# Patient Record
Sex: Female | Born: 1980 | Race: White | Hispanic: No | Marital: Single | Smoking: Current some day smoker
Health system: Southern US, Community
[De-identification: ages and names within clinical notes are randomized; demographics above are authoritative.]

## PROBLEM LIST (undated history)

## (undated) DIAGNOSIS — G8929 Other chronic pain: Secondary | ICD-10-CM

## (undated) DIAGNOSIS — O26619 Liver and biliary tract disorders in pregnancy, unspecified trimester: Secondary | ICD-10-CM

## (undated) DIAGNOSIS — Z9889 Other specified postprocedural states: Secondary | ICD-10-CM

## (undated) DIAGNOSIS — T4145XA Adverse effect of unspecified anesthetic, initial encounter: Secondary | ICD-10-CM

## (undated) DIAGNOSIS — R339 Retention of urine, unspecified: Secondary | ICD-10-CM

## (undated) DIAGNOSIS — B999 Unspecified infectious disease: Secondary | ICD-10-CM

## (undated) DIAGNOSIS — R519 Headache, unspecified: Secondary | ICD-10-CM

## (undated) DIAGNOSIS — J45909 Unspecified asthma, uncomplicated: Secondary | ICD-10-CM

## (undated) DIAGNOSIS — R51 Headache: Secondary | ICD-10-CM

## (undated) DIAGNOSIS — K831 Obstruction of bile duct: Secondary | ICD-10-CM

## (undated) DIAGNOSIS — R112 Nausea with vomiting, unspecified: Secondary | ICD-10-CM

## (undated) DIAGNOSIS — IMO0002 Reserved for concepts with insufficient information to code with codable children: Secondary | ICD-10-CM

## (undated) DIAGNOSIS — K589 Irritable bowel syndrome without diarrhea: Secondary | ICD-10-CM

## (undated) DIAGNOSIS — O26649 Intrahepatic cholestasis of pregnancy, unspecified trimester: Secondary | ICD-10-CM

## (undated) DIAGNOSIS — K219 Gastro-esophageal reflux disease without esophagitis: Secondary | ICD-10-CM

## (undated) DIAGNOSIS — T8859XA Other complications of anesthesia, initial encounter: Secondary | ICD-10-CM

## (undated) DIAGNOSIS — M549 Dorsalgia, unspecified: Secondary | ICD-10-CM

## (undated) DIAGNOSIS — M797 Fibromyalgia: Secondary | ICD-10-CM

## (undated) DIAGNOSIS — M722 Plantar fascial fibromatosis: Secondary | ICD-10-CM

## (undated) DIAGNOSIS — R3915 Urgency of urination: Secondary | ICD-10-CM

## (undated) HISTORY — DX: Irritable bowel syndrome, unspecified: K58.9

## (undated) HISTORY — DX: Other chronic pain: G89.29

## (undated) HISTORY — PX: GALLBLADDER SURGERY: SHX652

## (undated) HISTORY — DX: Headache: R51

## (undated) HISTORY — DX: Headache, unspecified: R51.9

## (undated) HISTORY — PX: WISDOM TOOTH EXTRACTION: SHX21

## (undated) HISTORY — DX: Fibromyalgia: M79.7

---
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
## 2002-06-24 ENCOUNTER — Ambulatory Visit (HOSPITAL_COMMUNITY): Admission: RE | Admit: 2002-06-24 | Discharge: 2002-06-24 | Payer: Self-pay | Admitting: Family Medicine

## 2002-06-24 ENCOUNTER — Encounter: Payer: Self-pay | Admitting: Family Medicine

## 2004-02-13 ENCOUNTER — Ambulatory Visit (HOSPITAL_COMMUNITY): Admission: RE | Admit: 2004-02-13 | Discharge: 2004-02-13 | Payer: Self-pay | Admitting: Internal Medicine

## 2004-04-08 ENCOUNTER — Ambulatory Visit (HOSPITAL_COMMUNITY): Admission: RE | Admit: 2004-04-08 | Discharge: 2004-04-08 | Payer: Self-pay | Admitting: Internal Medicine

## 2006-04-20 ENCOUNTER — Emergency Department (HOSPITAL_COMMUNITY): Admission: EM | Admit: 2006-04-20 | Discharge: 2006-04-20 | Payer: Self-pay | Admitting: Family Medicine

## 2006-09-18 ENCOUNTER — Emergency Department (HOSPITAL_COMMUNITY): Admission: EM | Admit: 2006-09-18 | Discharge: 2006-09-18 | Payer: Self-pay | Admitting: *Deleted

## 2006-11-14 ENCOUNTER — Emergency Department (HOSPITAL_COMMUNITY): Admission: EM | Admit: 2006-11-14 | Discharge: 2006-11-14 | Payer: Self-pay | Admitting: Family Medicine

## 2006-12-04 ENCOUNTER — Ambulatory Visit (HOSPITAL_COMMUNITY): Admission: RE | Admit: 2006-12-04 | Discharge: 2006-12-04 | Payer: Self-pay | Admitting: Chiropractic Medicine

## 2007-02-05 ENCOUNTER — Emergency Department (HOSPITAL_COMMUNITY): Admission: EM | Admit: 2007-02-05 | Discharge: 2007-02-05 | Payer: Self-pay | Admitting: Family Medicine

## 2007-03-15 ENCOUNTER — Emergency Department (HOSPITAL_COMMUNITY): Admission: EM | Admit: 2007-03-15 | Discharge: 2007-03-15 | Payer: Self-pay | Admitting: Family Medicine

## 2010-12-19 NOTE — L&D Delivery Note (Signed)
Pt slowly completed the 1st stage.  Pt pushed for 2 hours and developed exhaustion. The VE was placed at +3 station. She delivered one live viable white female over 2nd degree midline tear in ROA position. Placenta S/I. EBL-400cc.   Tear closed with 3-0 Chromic.

## 2011-04-30 ENCOUNTER — Emergency Department (HOSPITAL_COMMUNITY)
Admission: EM | Admit: 2011-04-30 | Discharge: 2011-05-01 | Disposition: A | Discharge status: Home / Self Care | Payer: Medicaid Other | Attending: Emergency Medicine | Admitting: Emergency Medicine

## 2011-04-30 DIAGNOSIS — R109 Unspecified abdominal pain: Secondary | ICD-10-CM | POA: Insufficient documentation

## 2011-04-30 DIAGNOSIS — N760 Acute vaginitis: Secondary | ICD-10-CM | POA: Insufficient documentation

## 2011-04-30 DIAGNOSIS — A499 Bacterial infection, unspecified: Secondary | ICD-10-CM | POA: Insufficient documentation

## 2011-04-30 DIAGNOSIS — B9689 Other specified bacterial agents as the cause of diseases classified elsewhere: Secondary | ICD-10-CM | POA: Insufficient documentation

## 2011-04-30 DIAGNOSIS — N949 Unspecified condition associated with female genital organs and menstrual cycle: Secondary | ICD-10-CM | POA: Insufficient documentation

## 2011-04-30 DIAGNOSIS — O99891 Other specified diseases and conditions complicating pregnancy: Secondary | ICD-10-CM | POA: Insufficient documentation

## 2011-04-30 DIAGNOSIS — O2390 Unspecified genitourinary tract infection in pregnancy, unspecified trimester: Secondary | ICD-10-CM | POA: Insufficient documentation

## 2011-05-01 ENCOUNTER — Emergency Department (HOSPITAL_COMMUNITY): Payer: Medicaid Other

## 2011-05-01 LAB — RPR: RPR: NONREACTIVE

## 2011-05-01 LAB — CBC
HCT: 38.9 % (ref 36.0–46.0)
MCH: 29.3 pg (ref 26.0–34.0)
MCV: 85.7 fL (ref 78.0–100.0)
RBC: 4.54 MIL/uL (ref 3.87–5.11)
RDW: 12.7 % (ref 11.5–15.5)
WBC: 7.4 10*3/uL (ref 4.0–10.5)

## 2011-05-01 LAB — URINALYSIS, ROUTINE W REFLEX MICROSCOPIC
Bilirubin Urine: NEGATIVE
Glucose, UA: NEGATIVE mg/dL
Ketones, ur: NEGATIVE mg/dL
Nitrite: NEGATIVE
Protein, ur: NEGATIVE mg/dL
Urobilinogen, UA: 1 mg/dL (ref 0.0–1.0)
pH: 6.5 (ref 5.0–8.0)

## 2011-05-01 LAB — URINE MICROSCOPIC-ADD ON

## 2011-05-01 LAB — WET PREP, GENITAL: Yeast Wet Prep HPF POC: NONE SEEN

## 2011-05-01 LAB — RUBELLA ANTIBODY, IGM: Rubella: NON-IMMUNE/NOT IMMUNE

## 2011-09-29 ENCOUNTER — Inpatient Hospital Stay (HOSPITAL_COMMUNITY)
Admission: AD | Admit: 2011-09-29 | Discharge: 2011-09-29 | Disposition: A | Discharge status: Home / Self Care | Payer: Medicaid Other | Source: Ambulatory Visit | Attending: Obstetrics and Gynecology | Admitting: Obstetrics and Gynecology

## 2011-09-29 ENCOUNTER — Encounter (HOSPITAL_COMMUNITY): Payer: Self-pay

## 2011-09-29 DIAGNOSIS — O2340 Unspecified infection of urinary tract in pregnancy, unspecified trimester: Secondary | ICD-10-CM

## 2011-09-29 DIAGNOSIS — N949 Unspecified condition associated with female genital organs and menstrual cycle: Secondary | ICD-10-CM | POA: Insufficient documentation

## 2011-09-29 DIAGNOSIS — N390 Urinary tract infection, site not specified: Secondary | ICD-10-CM

## 2011-09-29 DIAGNOSIS — O2390 Unspecified genitourinary tract infection in pregnancy, unspecified trimester: Secondary | ICD-10-CM | POA: Insufficient documentation

## 2011-09-29 LAB — URINALYSIS, ROUTINE W REFLEX MICROSCOPIC
Glucose, UA: NEGATIVE mg/dL
Protein, ur: NEGATIVE mg/dL
Specific Gravity, Urine: 1.02 (ref 1.005–1.030)
pH: 6.5 (ref 5.0–8.0)

## 2011-09-29 LAB — URINE MICROSCOPIC-ADD ON

## 2011-09-29 MED ORDER — NITROFURANTOIN MONOHYD MACRO 100 MG PO CAPS: 100.0000 mg | ORAL_CAPSULE | Freq: Two times a day (BID) | ORAL | Status: AC

## 2011-09-29 NOTE — ED Provider Notes (Signed)
History     Chief Complaint  Patient presents with  . Abdominal Pain   HPI G1 P0 at 26.5 weeks presents with c/o urinary discomfort and frequency for several days. Denies flank pain or fever. Denies contractions or bleeding. + fetal movement.  Has never had a UTI before. No history of PTL or other issues with pregnancy  No past medical history on file.  No past surgical history on file.  No family history on file.  History  Substance Use Topics  . Smoking status: Not on file  . Smokeless tobacco: Not on file  . Alcohol Use: Not on file    Allergies:  Allergies  Allergen Reactions  . Penicillins Other (See Comments)    Patient states that reaction to this medication, is a childhood reaction.    Prescriptions prior to admission  Medication Sig Dispense Refill  . Prenat w/o A Vit-FeFum-FePo-FA (FOLIVANE-OB) 130-92.4-1 MG CAPS Take 1 capsule by mouth daily.        . Prenat w/o A-FeCbn-DSS-FA-DHA (MACNATAL CN DHA) 28-1-250 MG CAPS Take 1 capsule by mouth daily.        . Prenat-FeFum-FePo-FA-Omega 3 (CONCEPT DHA) 53.5-38-1 MG CAPS Take 1 capsule by mouth daily.        . valACYclovir (VALTREX) 1000 MG tablet Take 1,000 mg by mouth 2 (two) times daily.          Review of Systems  Constitutional: Negative for fever and chills.  Gastrointestinal: Negative for nausea, vomiting, abdominal pain, diarrhea and constipation.  Genitourinary: Positive for dysuria and frequency.   Physical Exam   Blood pressure 129/68, pulse 87, temperature 98.4 F (36.9 C), temperature source Oral, resp. rate 20, height 5\' 4"  (1.626 m), weight 191 lb 6.4 oz (86.818 kg), SpO2 99.00%.  Physical Exam  Constitutional: She is oriented to person, place, and time. She appears well-developed and well-nourished.  HENT:  Head: Normocephalic.  Cardiovascular: Normal rate.   Respiratory: Effort normal.  GI: Soft. There is no tenderness. There is no rebound and no guarding.  Genitourinary: Vagina normal and  uterus normal. No vaginal discharge found.       EFM reassuring FHR with no contractions. Cervix closed/long/-3.  Musculoskeletal: Normal range of motion.  Neurological: She is alert and oriented to person, place, and time.  Skin: Skin is warm and dry.  Psychiatric: She has a normal mood and affect.    MAU Course  Procedures   Assessment and Plan  A:  Pelvic pain with dysuria Probable UTI   P:  Consulted Dr Henderson Cloud Will treat for UTI and d/c home Urine to culture  [facility] 09/29/2011, 6:42 PM

## 2011-09-29 NOTE — Progress Notes (Signed)
PT SAYS THIS LOWER PAIN STARTED  WED AM-  SHE CALLED OFFICE THIS AM-  TOLD TO COME HERE.  HURTS TO URINATE- AND FREQ.Marland Kitchen  DENIES UC, BLEEDING, ROM.    ANY MOVEMENT  HURTS.

## 2011-09-29 NOTE — Progress Notes (Signed)
Pt states she started having constant lower abdominal pain 10-10 in the am. Has been having urinary frequency with only urinating a little for about one week. Now painful with urination. No leaking or bleeding. Reports fetal movement.

## 2011-10-01 LAB — URINE CULTURE: Colony Count: 70000

## 2011-12-06 ENCOUNTER — Inpatient Hospital Stay (HOSPITAL_COMMUNITY)
Admission: AD | Admit: 2011-12-06 | Discharge: 2011-12-06 | Disposition: A | Discharge status: Home / Self Care | Payer: Medicaid Other | Source: Ambulatory Visit | Attending: Obstetrics and Gynecology | Admitting: Obstetrics and Gynecology

## 2011-12-06 ENCOUNTER — Other Ambulatory Visit: Payer: Self-pay | Admitting: Obstetrics and Gynecology

## 2011-12-06 ENCOUNTER — Encounter (HOSPITAL_COMMUNITY): Payer: Self-pay | Admitting: *Deleted

## 2011-12-06 DIAGNOSIS — O99891 Other specified diseases and conditions complicating pregnancy: Secondary | ICD-10-CM | POA: Insufficient documentation

## 2011-12-06 DIAGNOSIS — R7989 Other specified abnormal findings of blood chemistry: Secondary | ICD-10-CM

## 2011-12-06 LAB — URINE MICROSCOPIC-ADD ON

## 2011-12-06 LAB — STREP B DNA PROBE: GBS: POSITIVE

## 2011-12-06 LAB — URINALYSIS, ROUTINE W REFLEX MICROSCOPIC
Bilirubin Urine: NEGATIVE
Glucose, UA: NEGATIVE mg/dL
Hgb urine dipstick: NEGATIVE
Ketones, ur: NEGATIVE mg/dL
Nitrite: NEGATIVE
Protein, ur: NEGATIVE mg/dL
Specific Gravity, Urine: >1.03 — ABNORMAL HIGH (ref 1.005–1.030)
Urobilinogen, UA: 0.2 mg/dL (ref 0.0–1.0)
pH: 6 (ref 5.0–8.0)

## 2011-12-06 LAB — CULTURE, BETA STREP (GROUP B ONLY): Organism ID, Bacteria: POSITIVE

## 2011-12-06 NOTE — Progress Notes (Signed)
Pt sent from office due to lab results.  Pt states "MD told me that my liver is not working and I need to go to office."    Denies any headaches, dizziness, or blurred vision.  Reports extreme itching.  Denies any bleeding or leaking of fluid.  + FM.

## 2011-12-06 NOTE — ED Provider Notes (Signed)
History   Pt presents today for evaluation of elevated LFTs. She is currently 36.3wks. She denies HA, blurry vision, or RUQ pain. She states she has been itching terribly for the past couple of months. She reports GFM.  Chief Complaint  Patient presents with  . Pruritis   HPI  OB History    Grav Para Term Preterm Abortions TAB SAB Ect Mult Living   1               Past Medical History  Diagnosis Date  . Hypertension     Past Surgical History  Procedure Date  . Wisdom tooth extraction     No family history on file.  History  Substance Use Topics  . Smoking status: Former Games developer  . Smokeless tobacco: Not on file  . Alcohol Use: No    Allergies:  Allergies  Allergen Reactions  . Penicillins Other (See Comments)    Patient states that reaction to this medication, is a childhood reaction.    Prescriptions prior to admission  Medication Sig Dispense Refill  . hydrocortisone cream 1 % Apply 1 application topically daily as needed. For itching.       Burnis Medin w/o A Vit-FeFum-FePo-FA (FOLIVANE-OB) 130-92.4-1 MG CAPS Take 1 capsule by mouth daily.        Marland Kitchen zolpidem (AMBIEN) 10 MG tablet Take 10 mg by mouth at bedtime as needed. For sleep.         Review of Systems  Constitutional: Negative for fever.  Cardiovascular: Negative for chest pain and palpitations.  Gastrointestinal: Negative for nausea, vomiting, abdominal pain, diarrhea and constipation.  Genitourinary: Negative for dysuria, urgency, frequency and hematuria.  Skin: Positive for itching. Negative for rash.  Neurological: Negative for dizziness and headaches.  Psychiatric/Behavioral: Negative for depression and suicidal ideas.   Physical Exam   Blood pressure 119/76, pulse 98, temperature 97.9 F (36.6 C), temperature source Oral, resp. rate 18, height 5\' 3"  (1.6 m), weight 208 lb 6.4 oz (94.53 kg).  Physical Exam  Nursing note and vitals reviewed. Constitutional: She is oriented to person, place, and  time. She appears well-developed and well-nourished. No distress.  HENT:  Head: Normocephalic and atraumatic.  Eyes: EOM are normal. Pupils are equal, round, and reactive to light.  GI: Soft. She exhibits no distension. There is no tenderness. There is no rebound and no guarding.  Neurological: She is alert and oriented to person, place, and time.  Skin: Skin is warm and dry. She is not diaphoretic.  Psychiatric: She has a normal mood and affect. Her behavior is normal. Judgment and thought content normal.    MAU Course  Procedures  Labs sent from office and reviewed. ALT - 203, AST - 68, platelets 233  Discussed pt with Dr. Henderson Cloud at length.  Assessment and Plan  Elevated LFTs: Will check bile acids and hepatitis panel. Pt will f/u in office on Thursday. No evidence of preeclampsia today. Discussed diet, activity, risks, and precautions. Reminded of [facility].  Clinton Gallant. Rice III, DrHSc, MPAS, PA-C  12/06/2011, 3:08 PM   Henrietta Hoover, PA 12/06/11 1513

## 2011-12-07 LAB — HEPATITIS PANEL, ACUTE
Hep B C IgM: NEGATIVE
Hepatitis B Surface Ag: NEGATIVE

## 2011-12-08 LAB — BILE ACIDS, TOTAL: Bile Acids Total: 11 umol/L (ref 0–19)

## 2011-12-09 ENCOUNTER — Inpatient Hospital Stay (HOSPITAL_COMMUNITY)
Admission: AD | Admit: 2011-12-09 | Discharge: 2011-12-12 | DRG: 775 | Disposition: A | Discharge status: Home / Self Care | Payer: Medicaid Other | Source: Ambulatory Visit | Attending: Obstetrics and Gynecology | Admitting: Obstetrics and Gynecology

## 2011-12-09 ENCOUNTER — Encounter (HOSPITAL_COMMUNITY): Payer: Self-pay | Admitting: *Deleted

## 2011-12-09 DIAGNOSIS — O99892 Other specified diseases and conditions complicating childbirth: Secondary | ICD-10-CM | POA: Diagnosis present

## 2011-12-09 DIAGNOSIS — Z3480 Encounter for supervision of other normal pregnancy, unspecified trimester: Secondary | ICD-10-CM

## 2011-12-09 DIAGNOSIS — Z22330 Carrier of Group B streptococcus: Secondary | ICD-10-CM

## 2011-12-09 DIAGNOSIS — K838 Other specified diseases of biliary tract: Secondary | ICD-10-CM | POA: Diagnosis present

## 2011-12-09 HISTORY — DX: Liver and biliary tract disorders in pregnancy, unspecified trimester: O26.619

## 2011-12-09 HISTORY — DX: Unspecified infectious disease: B99.9

## 2011-12-09 HISTORY — DX: Obstruction of bile duct: K83.1

## 2011-12-09 HISTORY — DX: Intrahepatic cholestasis of pregnancy, unspecified trimester: O26.649

## 2011-12-09 LAB — CBC
Hemoglobin: 12 g/dL (ref 12.0–15.0)
MCHC: 33.7 g/dL (ref 30.0–36.0)
RDW: 13.2 % (ref 11.5–15.5)

## 2011-12-09 LAB — COMPREHENSIVE METABOLIC PANEL
Albumin: 2.5 g/dL — ABNORMAL LOW (ref 3.5–5.2)
BUN: 7 mg/dL (ref 6–23)
Calcium: 9 mg/dL (ref 8.4–10.5)
Chloride: 101 mEq/L (ref 96–112)
Creatinine, Ser: 0.57 mg/dL (ref 0.50–1.10)
Total Bilirubin: 0.2 mg/dL — ABNORMAL LOW (ref 0.3–1.2)

## 2011-12-09 LAB — URIC ACID: Uric Acid, Serum: 3.9 mg/dL (ref 2.4–7.0)

## 2011-12-09 LAB — LACTATE DEHYDROGENASE: LDH: 207 U/L (ref 94–250)

## 2011-12-09 MED ORDER — OXYTOCIN 20 UNITS IN LACTATED RINGERS INFUSION - SIMPLE
1.0000 m[IU]/min | INTRAVENOUS | Status: DC
  Administered 2011-12-09: 2 m[IU]/min via INTRAVENOUS
  Administered 2011-12-10: 300 m[IU]/min via INTRAVENOUS
  Filled 2011-12-09: qty 1000

## 2011-12-09 MED ORDER — LACTATED RINGERS IV SOLN
INTRAVENOUS | Status: DC
  Administered 2011-12-10: 02:00:00 via INTRAVENOUS

## 2011-12-09 MED ORDER — NALBUPHINE SYRINGE 5 MG/0.5 ML
5.0000 mg | INJECTION | INTRAMUSCULAR | Status: DC | PRN
  Administered 2011-12-10: 5 mg via INTRAVENOUS
  Filled 2011-12-09: qty 0.5

## 2011-12-09 MED ORDER — VALACYCLOVIR HCL 500 MG PO TABS
500.0000 mg | ORAL_TABLET | Freq: Once | ORAL | Status: AC
  Administered 2011-12-09: 500 mg via ORAL
  Filled 2011-12-09: qty 1

## 2011-12-09 MED ORDER — CEFAZOLIN SODIUM-DEXTROSE 2-3 GM-% IV SOLR
2.0000 g | Freq: Once | INTRAVENOUS | Status: AC
  Administered 2011-12-09: 2 g via INTRAVENOUS
  Filled 2011-12-09: qty 50

## 2011-12-09 MED ORDER — LIDOCAINE HCL (PF) 1 % IJ SOLN
30.0000 mL | INTRAMUSCULAR | Status: DC | PRN
  Administered 2011-12-10: 30 mL via SUBCUTANEOUS
  Filled 2011-12-09: qty 30

## 2011-12-09 MED ORDER — OXYCODONE-ACETAMINOPHEN 5-325 MG PO TABS
2.0000 | ORAL_TABLET | ORAL | Status: DC | PRN
  Administered 2011-12-10: 1 via ORAL
  Filled 2011-12-09: qty 2

## 2011-12-09 MED ORDER — TERBUTALINE SULFATE 1 MG/ML IJ SOLN: 0.2500 mg | Freq: Once | INTRAMUSCULAR | Status: AC | PRN

## 2011-12-09 MED ORDER — CEFAZOLIN SODIUM 1-5 GM-% IV SOLN
1.0000 g | Freq: Three times a day (TID) | INTRAVENOUS | Status: DC
  Administered 2011-12-10 (×2): 1 g via INTRAVENOUS
  Filled 2011-12-09 (×3): qty 50

## 2011-12-09 MED ORDER — BUTORPHANOL TARTRATE 2 MG/ML IJ SOLN
1.0000 mg | INTRAMUSCULAR | Status: DC | PRN
  Administered 2011-12-10 (×2): 1 mg via INTRAVENOUS
  Filled 2011-12-09 (×2): qty 1

## 2011-12-09 MED ORDER — ZOLPIDEM TARTRATE 10 MG PO TABS: 10.0000 mg | ORAL_TABLET | Freq: Every evening | ORAL | Status: DC | PRN

## 2011-12-09 MED ORDER — OXYTOCIN 20 UNITS IN LACTATED RINGERS INFUSION - SIMPLE
125.0000 mL/h | Freq: Once | INTRAVENOUS | Status: AC
  Administered 2011-12-10: 500 mL/h via INTRAVENOUS

## 2011-12-09 MED ORDER — CITRIC ACID-SODIUM CITRATE 334-500 MG/5ML PO SOLN: 30.0000 mL | ORAL | Status: DC | PRN

## 2011-12-09 MED ORDER — LACTATED RINGERS IV SOLN
INTRAVENOUS | Status: DC
  Administered 2011-12-09 – 2011-12-10 (×5): via INTRAVENOUS

## 2011-12-09 MED ORDER — IBUPROFEN 600 MG PO TABS
600.0000 mg | ORAL_TABLET | Freq: Four times a day (QID) | ORAL | Status: DC | PRN
  Administered 2011-12-10: 600 mg via ORAL
  Filled 2011-12-09: qty 1

## 2011-12-09 MED ORDER — ACETAMINOPHEN 325 MG PO TABS: 650.0000 mg | ORAL_TABLET | ORAL | Status: DC | PRN

## 2011-12-09 MED ORDER — LACTATED RINGERS IV SOLN
500.0000 mL | INTRAVENOUS | Status: DC | PRN
  Administered 2011-12-10 (×2): 500 mL via INTRAVENOUS

## 2011-12-09 MED ORDER — FLEET ENEMA 7-19 GM/118ML RE ENEM: 1.0000 | ENEMA | RECTAL | Status: DC | PRN

## 2011-12-09 MED ORDER — HYDROXYZINE HCL 50 MG/ML IM SOLN
25.0000 mg | Freq: Four times a day (QID) | INTRAMUSCULAR | Status: DC | PRN
  Administered 2011-12-09 (×2): 25 mg via INTRAMUSCULAR
  Filled 2011-12-09 (×3): qty 1

## 2011-12-09 MED ORDER — ONDANSETRON HCL 4 MG/2ML IJ SOLN
4.0000 mg | Freq: Four times a day (QID) | INTRAMUSCULAR | Status: DC | PRN
  Administered 2011-12-10: 4 mg via INTRAVENOUS
  Filled 2011-12-09: qty 2

## 2011-12-09 MED ORDER — OXYTOCIN BOLUS FROM INFUSION
500.0000 mL | Freq: Once | INTRAVENOUS | Status: DC
  Filled 2011-12-09: qty 500
  Filled 2011-12-09: qty 1000

## 2011-12-09 NOTE — Progress Notes (Signed)
2130:Pt ambulatory to room 172 for IOL for cholestasis, accompanied by family, myself, and B. Daphine Deutscher, CNA.  Report given to Darrow Bussing, RN.  Cheral Marker

## 2011-12-09 NOTE — Progress Notes (Signed)
Room finally available for pt.   FHTs NST R. SVE 1-2/60/-2, AROM clear  Bile acids in office all elevated- confirms diagnosis of cholestasis of pregnancy.  Needs induction. Drelyn Pistilli A

## 2011-12-09 NOTE — Progress Notes (Signed)
Pt in for scheduled induction for elevated LFT's, brought to MAU while waiting for a bed on birthing suites.  Denies any bleeding, leaking of fluid, or discharge. + FM.

## 2011-12-09 NOTE — H&P (Signed)
30 y.o. [redacted]w[redacted]d   G1P0 comes in c/o itching and elevated LFTs.  Otherwise has good fetal movement and no bleeding.  Pt has no s/s preeclampsia; symptoms consistent with cholestasis.  Recommendation is for delivery at term with elevated LFTS.  LFTs have decreased slightly from last Friday but are still elevated.  Past Medical History  Diagnosis Date  . Hypertension   . Cholestasis of pregnancy     Past Surgical History  Procedure Date  . Wisdom tooth extraction     OB History    Grav Para Term Preterm Abortions TAB SAB Ect Mult Living   1              # Outc Date GA Lbr Len/2nd Wgt Sex Del Anes PTL Lv   1 CUR               History   Social History  . Marital Status: Single    Spouse Name: N/A    Number of Children: N/A  . Years of Education: N/A   Occupational History  . Not on file.   Social History Main Topics  . Smoking status: Former Games developer  . Smokeless tobacco: Not on file  . Alcohol Use: No  . Drug Use: No  . Sexually Active: Yes   Other Topics Concern  . Not on file   Social History Narrative  . No narrative on file   Penicillins   Prenatal Course:  Presumed cholestasis- bile acids pending.  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  For induction of labor.   GBS positive and resistant to clindamycin.  D/w pt rare risk of cross reaction of allergy with penicillin.   No s/s active HSV lesion.  Guiseppe Flanagan A

## 2011-12-10 ENCOUNTER — Encounter (HOSPITAL_COMMUNITY): Payer: Self-pay | Admitting: Anesthesiology

## 2011-12-10 ENCOUNTER — Encounter (HOSPITAL_COMMUNITY): Payer: Self-pay

## 2011-12-10 ENCOUNTER — Inpatient Hospital Stay (HOSPITAL_COMMUNITY): Payer: Medicaid Other | Admitting: Anesthesiology

## 2011-12-10 LAB — RPR: RPR Ser Ql: NONREACTIVE

## 2011-12-10 MED ORDER — DIPHENHYDRAMINE HCL 50 MG/ML IJ SOLN: 12.5000 mg | INTRAMUSCULAR | Status: DC | PRN

## 2011-12-10 MED ORDER — LIDOCAINE HCL 1.5 % IJ SOLN
INTRAMUSCULAR | Status: DC | PRN
  Administered 2011-12-10 (×2): 5 mL via EPIDURAL

## 2011-12-10 MED ORDER — OXYCODONE-ACETAMINOPHEN 5-325 MG PO TABS
1.0000 | ORAL_TABLET | ORAL | Status: DC | PRN
  Administered 2011-12-11 (×2): 2 via ORAL
  Administered 2011-12-11 (×2): 1 via ORAL
  Administered 2011-12-12 (×2): 2 via ORAL
  Filled 2011-12-10: qty 1
  Filled 2011-12-10: qty 2
  Filled 2011-12-10: qty 1
  Filled 2011-12-10 (×2): qty 2
  Filled 2011-12-10 (×2): qty 1

## 2011-12-10 MED ORDER — ONDANSETRON HCL 4 MG/2ML IJ SOLN: 4.0000 mg | INTRAMUSCULAR | Status: DC | PRN

## 2011-12-10 MED ORDER — LACTATED RINGERS IV SOLN
500.0000 mL | Freq: Once | INTRAVENOUS | Status: AC
  Administered 2011-12-10: 500 mL via INTRAVENOUS

## 2011-12-10 MED ORDER — BENZOCAINE-MENTHOL 20-0.5 % EX AERO
1.0000 "application " | INHALATION_SPRAY | CUTANEOUS | Status: DC | PRN
  Administered 2011-12-11 (×2): 1 via TOPICAL

## 2011-12-10 MED ORDER — ONDANSETRON HCL 4 MG PO TABS: 4.0000 mg | ORAL_TABLET | ORAL | Status: DC | PRN

## 2011-12-10 MED ORDER — SIMETHICONE 80 MG PO CHEW
80.0000 mg | CHEWABLE_TABLET | ORAL | Status: DC | PRN
  Administered 2011-12-12: 80 mg via ORAL

## 2011-12-10 MED ORDER — TETANUS-DIPHTH-ACELL PERTUSSIS 5-2.5-18.5 LF-MCG/0.5 IM SUSP
0.5000 mL | Freq: Once | INTRAMUSCULAR | Status: AC
  Administered 2011-12-11: 0.5 mL via INTRAMUSCULAR

## 2011-12-10 MED ORDER — PHENYLEPHRINE 40 MCG/ML (10ML) SYRINGE FOR IV PUSH (FOR BLOOD PRESSURE SUPPORT): 80.0000 ug | PREFILLED_SYRINGE | INTRAVENOUS | Status: DC | PRN

## 2011-12-10 MED ORDER — DIBUCAINE 1 % RE OINT: 1.0000 "application " | TOPICAL_OINTMENT | RECTAL | Status: DC | PRN

## 2011-12-10 MED ORDER — PHENYLEPHRINE 40 MCG/ML (10ML) SYRINGE FOR IV PUSH (FOR BLOOD PRESSURE SUPPORT)
80.0000 ug | PREFILLED_SYRINGE | INTRAVENOUS | Status: DC | PRN
  Filled 2011-12-10: qty 5

## 2011-12-10 MED ORDER — FENTANYL 2.5 MCG/ML BUPIVACAINE 1/10 % EPIDURAL INFUSION (WH - ANES)
14.0000 mL/h | INTRAMUSCULAR | Status: DC
  Administered 2011-12-10 (×4): 14 mL/h via EPIDURAL
  Filled 2011-12-10 (×5): qty 60

## 2011-12-10 MED ORDER — ZOLPIDEM TARTRATE 5 MG PO TABS: 5.0000 mg | ORAL_TABLET | Freq: Every evening | ORAL | Status: DC | PRN

## 2011-12-10 MED ORDER — WITCH HAZEL-GLYCERIN EX PADS: 1.0000 "application " | MEDICATED_PAD | CUTANEOUS | Status: DC | PRN

## 2011-12-10 MED ORDER — MEASLES, MUMPS & RUBELLA VAC ~~LOC~~ INJ
0.5000 mL | INJECTION | Freq: Once | SUBCUTANEOUS | Status: AC
  Administered 2011-12-12: 0.5 mL via SUBCUTANEOUS
  Filled 2011-12-10 (×2): qty 0.5

## 2011-12-10 MED ORDER — FENTANYL 2.5 MCG/ML BUPIVACAINE 1/10 % EPIDURAL INFUSION (WH - ANES)
INTRAMUSCULAR | Status: DC | PRN
  Administered 2011-12-10: 14 mL/h via EPIDURAL

## 2011-12-10 MED ORDER — IBUPROFEN 600 MG PO TABS
600.0000 mg | ORAL_TABLET | Freq: Four times a day (QID) | ORAL | Status: DC
  Administered 2011-12-11 – 2011-12-12 (×6): 600 mg via ORAL
  Filled 2011-12-10 (×6): qty 1

## 2011-12-10 MED ORDER — EPHEDRINE 5 MG/ML INJ: 10.0000 mg | INTRAVENOUS | Status: DC | PRN

## 2011-12-10 MED ORDER — EPHEDRINE 5 MG/ML INJ
10.0000 mg | INTRAVENOUS | Status: DC | PRN
  Filled 2011-12-10: qty 4

## 2011-12-10 NOTE — Anesthesia Procedure Notes (Signed)
Epidural Patient location during procedure: OB Start time: 12/10/2011 5:21 AM End time: 12/10/2011 5:26 AM Reason for block: procedure for pain  Staffing Anesthesiologist: Sandrea Hughs Performed by: anesthesiologist   Preanesthetic Checklist Completed: patient identified, site marked, surgical consent, pre-op evaluation, timeout performed, IV checked, risks and benefits discussed and monitors and equipment checked  Epidural Patient position: sitting Prep: site prepped and draped and DuraPrep Patient monitoring: continuous pulse ox and blood pressure Approach: midline Injection technique: LOR air  Needle:  Needle type: Tuohy  Needle gauge: 17 G Needle length: 9 cm Needle insertion depth: 6 cm Catheter type: closed end flexible Catheter size: 19 Gauge Catheter at skin depth: 12 cm Test dose: negative and 1.5% lidocaine  Assessment Sensory level: T8 Events: blood not aspirated, injection not painful, no injection resistance, negative IV test and no paresthesia

## 2011-12-10 NOTE — Progress Notes (Signed)
Laboring Down.

## 2011-12-10 NOTE — Plan of Care (Signed)
Problem: Consults Goal: Birthing Suites Patient Information Press F2 to bring up selections list  Outcome: Completed/Met Date Met:  12/10/11  Pt 37-[redacted] weeks EGA, Inpatient induction and Other (specify with a note) pt has cholestasis

## 2011-12-10 NOTE — Anesthesia Preprocedure Evaluation (Addendum)
Anesthesia Evaluation  Patient identified by MRN, date of birth, ID band Patient awake    Reviewed: Allergy & Precautions, H&P , NPO status , Patient's Chart, lab work & pertinent test results  Airway Mallampati: II TM Distance: >3 FB Neck ROM: full    Dental No notable dental hx.    Pulmonary neg pulmonary ROS,    Pulmonary exam normal       Cardiovascular     Neuro/Psych Negative Neurological ROS  Negative Psych ROS   GI/Hepatic negative GI ROS, Neg liver ROS,   Endo/Other  Morbid obesity  Renal/GU negative Renal ROS  Genitourinary negative   Musculoskeletal   Abdominal (+) obese,   Peds negative pediatric ROS (+)  Hematology negative hematology ROS (+)   Anesthesia Other Findings   Reproductive/Obstetrics (+) Pregnancy                           Anesthesia Physical Anesthesia Plan  ASA: III  Anesthesia Plan: Epidural   Post-op Pain Management:    Induction:   Airway Management Planned:   Additional Equipment:   Intra-op Plan:   Post-operative Plan:   Informed Consent: I have reviewed the patients History and Physical, chart, labs and discussed the procedure including the risks, benefits and alternatives for the proposed anesthesia with the patient or authorized representative who has indicated his/her understanding and acceptance.     Plan Discussed with:   Anesthesia Plan Comments:         Anesthesia Quick Evaluation

## 2011-12-10 NOTE — Progress Notes (Signed)
Rec'd report & assumed pt care 

## 2011-12-11 LAB — CBC
Hemoglobin: 9.9 g/dL — ABNORMAL LOW (ref 12.0–15.0)
Platelets: 197 10*3/uL (ref 150–400)
RBC: 3.36 MIL/uL — ABNORMAL LOW (ref 3.87–5.11)
WBC: 19.9 10*3/uL — ABNORMAL HIGH (ref 4.0–10.5)

## 2011-12-11 MED ORDER — RHO D IMMUNE GLOBULIN 1500 UNIT/2ML IJ SOLN
300.0000 ug | Freq: Once | INTRAMUSCULAR | Status: AC
  Administered 2011-12-11: 300 ug via INTRAMUSCULAR
  Filled 2011-12-11: qty 2

## 2011-12-11 MED ORDER — SENNOSIDES-DOCUSATE SODIUM 8.6-50 MG PO TABS
2.0000 | ORAL_TABLET | Freq: Every day | ORAL | Status: DC
  Administered 2011-12-11: 2 via ORAL

## 2011-12-11 MED ORDER — BENZOCAINE-MENTHOL 20-0.5 % EX AERO
INHALATION_SPRAY | CUTANEOUS | Status: AC
  Administered 2011-12-11: 1 via TOPICAL
  Filled 2011-12-11: qty 56

## 2011-12-11 MED ORDER — INFLUENZA VIRUS VACC SPLIT PF IM SUSP
0.5000 mL | INTRAMUSCULAR | Status: AC
  Administered 2011-12-11: 0.5 mL via INTRAMUSCULAR
  Filled 2011-12-11: qty 0.5

## 2011-12-11 NOTE — Progress Notes (Signed)
PPD#1 Pt without c/o. Lochia-wnl. IMP/ doing well Plan/ routine care.

## 2011-12-11 NOTE — Anesthesia Postprocedure Evaluation (Signed)
  Anesthesia Post-op Note  Patient: Kiara Dillon  Procedure(s) Performed: * No procedures listed *  Patient Location: Mother/Baby  Anesthesia Type: Epidural  Level of Consciousness: awake, alert  and oriented  Airway and Oxygen Therapy: Patient Spontanous Breathing  Post-op Assessment: Patient's Cardiovascular Status Stable and Respiratory Function Stable  Post-op Vital Signs: Reviewed and stable  Complications: No apparent anesthesia complications

## 2011-12-11 NOTE — Progress Notes (Signed)
Patient does not have order for stool softeners and requests them. Dr. Dareen Piano called. Order received.

## 2011-12-12 LAB — RH IG WORKUP (INCLUDES ABO/RH)
Antibody Screen: POSITIVE
Fetal Screen: NEGATIVE
Gestational Age(Wks): 37

## 2011-12-12 MED ORDER — OXYCODONE-ACETAMINOPHEN 5-325 MG PO TABS: 1.0000 | ORAL_TABLET | ORAL | Status: AC | PRN

## 2011-12-12 NOTE — Progress Notes (Signed)
PPD#2 Pt without c/o. Wants to go home. VSSAF Plan/D/C

## 2011-12-12 NOTE — Progress Notes (Signed)
UR chart review completed.  

## 2011-12-12 NOTE — Discharge Summary (Signed)
Obstetric Discharge Summary Reason for Admission: induction of labor Prenatal Procedures: ultrasound Intrapartum Procedures: VE Postpartum Procedures: none Complications-Operative and Postpartum: 2 degree perineal laceration Hemoglobin  Date Value Range Status  12/11/2011 9.9* 12.0-15.0 (g/dL) Final     RESULT REPEATED AND VERIFIED     DELTA CHECK NOTED     HCT  Date Value Range Status  12/11/2011 29.5* 36.0-46.0 (%) Final    Discharge Diagnoses: Term Pregnancy-delivered  Discharge Information: Date: 12/12/2011 Activity: pelvic rest Diet: routine Medications: PNV and Percocet Condition: improved Instructions: refer to practice specific booklet Discharge to: home Follow-up Information    Follow up with ROSS,KENDRA H. in 1 week.   Contact information:   [address] [place] [address] [phone]          Newborn Data: Live born female  Birth Weight: 6 lb 6.3 oz (2900 g) APGAR: 9, 9  Home with mother.  Samanyu Tinnell E 12/12/2011, 9:45 AM

## 2012-02-09 ENCOUNTER — Encounter (HOSPITAL_COMMUNITY): Payer: Self-pay | Admitting: *Deleted

## 2012-02-09 ENCOUNTER — Emergency Department (HOSPITAL_COMMUNITY)
Admission: EM | Admit: 2012-02-09 | Discharge: 2012-02-09 | Disposition: A | Discharge status: Home / Self Care | Payer: Medicaid Other | Attending: Emergency Medicine | Admitting: Emergency Medicine

## 2012-02-09 DIAGNOSIS — M5380 Other specified dorsopathies, site unspecified: Secondary | ICD-10-CM | POA: Insufficient documentation

## 2012-02-09 DIAGNOSIS — I10 Essential (primary) hypertension: Secondary | ICD-10-CM | POA: Insufficient documentation

## 2012-02-09 DIAGNOSIS — N898 Other specified noninflammatory disorders of vagina: Secondary | ICD-10-CM | POA: Insufficient documentation

## 2012-02-09 DIAGNOSIS — R109 Unspecified abdominal pain: Secondary | ICD-10-CM | POA: Insufficient documentation

## 2012-02-09 DIAGNOSIS — M545 Low back pain, unspecified: Secondary | ICD-10-CM

## 2012-02-09 DIAGNOSIS — N949 Unspecified condition associated with female genital organs and menstrual cycle: Secondary | ICD-10-CM | POA: Insufficient documentation

## 2012-02-09 LAB — URINE MICROSCOPIC-ADD ON

## 2012-02-09 LAB — URINALYSIS, ROUTINE W REFLEX MICROSCOPIC
Bilirubin Urine: NEGATIVE
Nitrite: NEGATIVE
Specific Gravity, Urine: 1.025 (ref 1.005–1.030)
pH: 5.5 (ref 5.0–8.0)

## 2012-02-09 LAB — PREGNANCY, URINE: Preg Test, Ur: NEGATIVE

## 2012-02-09 MED ORDER — CYCLOBENZAPRINE HCL 10 MG PO TABS: 10.0000 mg | ORAL_TABLET | Freq: Two times a day (BID) | ORAL | Status: AC | PRN

## 2012-02-09 MED ORDER — KETOROLAC TROMETHAMINE 60 MG/2ML IM SOLN
60.0000 mg | Freq: Once | INTRAMUSCULAR | Status: AC
  Administered 2012-02-09: 60 mg via INTRAMUSCULAR
  Filled 2012-02-09: qty 2

## 2012-02-09 MED ORDER — HYDROCODONE-ACETAMINOPHEN 5-325 MG PO TABS: 1.0000 | ORAL_TABLET | ORAL | Status: AC | PRN

## 2012-02-09 NOTE — ED Provider Notes (Signed)
History     CSN: [id number]  Arrival date & time 02/09/12  1555   First MD Initiated Contact with Patient 02/09/12 1638      Chief Complaint  Patient presents with  . Back Pain    L lowerback    (Consider location/radiation/quality/duration/timing/severity/associated sxs/prior treatment) HPI  Past Medical History  Diagnosis Date  . Hypertension   . Cholestasis of pregnancy   . 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 Wisdom tooth extraction   . No past surgeries     Family History  Problem Relation Age of Onset  . Arthritis Mother   . Asthma Mother   . Depression Mother   . Hypertension Mother   . Miscarriages / Stillbirths Maternal Aunt   . Stroke Paternal Uncle   . Heart disease Paternal Uncle   . Cancer Maternal Grandmother   . Cancer Maternal Grandfather   . Cancer Paternal Grandmother   . Heart disease Paternal Grandmother   . Cancer Paternal Grandfather     History  Substance Use Topics  . Smoking status: Former Games developer  . Smokeless tobacco: Not on file  . Alcohol Use: Yes    OB History    Grav Para Term Preterm Abortions TAB SAB Ect Mult Living   1 1 1  0 0 0 0 0 0 1      Review of Systems  Allergies  Penicillins  Home Medications   Current Outpatient Rx  Name Route Sig Dispense Refill  . GABAPENTIN 300 MG PO CAPS Oral Take 300 mg by mouth 3 (three) times daily.      BP 120/79  Pulse 88  Temp(Src) 98.1 F (36.7 C) (Oral)  Resp 20  SpO2 100%  Physical Exam  ED Course  Procedures (including critical care time)  Labs Reviewed  URINALYSIS, ROUTINE W REFLEX MICROSCOPIC - Abnormal; Notable for the following:    APPearance CLOUDY (*)    Hgb urine dipstick LARGE (*)    Ketones, ur TRACE (*)    Leukocytes, UA LARGE (*)    All other components within normal limits  URINE MICROSCOPIC-ADD ON - Abnormal; Notable for the following:    Squamous Epithelial / LPF MANY (*)    All  other components within normal limits  WET PREP, GENITAL - Abnormal; Notable for the following:    WBC, Wet Prep HPF POC MODERATE (*)    All other components within normal limits  PREGNANCY, URINE  GC/CHLAMYDIA PROBE AMP, GENITAL  URINE CULTURE   No results found. Results for orders placed during the hospital encounter of 02/09/12  URINALYSIS, ROUTINE W REFLEX MICROSCOPIC      Component Value Range   Color, Urine YELLOW  YELLOW    APPearance CLOUDY (*) CLEAR    Specific Gravity, Urine 1.025  1.005 - 1.030    pH 5.5  5.0 - 8.0    Glucose, UA NEGATIVE  NEGATIVE (mg/dL)   Hgb urine dipstick LARGE (*) NEGATIVE    Bilirubin Urine NEGATIVE  NEGATIVE    Ketones, ur TRACE (*) NEGATIVE (mg/dL)   Protein, ur NEGATIVE  NEGATIVE (mg/dL)   Urobilinogen, UA 0.2  0.0 - 1.0 (mg/dL)   Nitrite NEGATIVE  NEGATIVE    Leukocytes, UA LARGE (*) NEGATIVE   PREGNANCY, URINE      Component Value Range   Preg Test, Ur NEGATIVE  NEGATIVE   URINE MICROSCOPIC-ADD ON  Component Value Range   Squamous Epithelial / LPF MANY (*) RARE    WBC, UA 7-10  <3 (WBC/hpf)   RBC / HPF 3-6  <3 (RBC/hpf)   Bacteria, UA RARE  RARE   WET PREP, GENITAL      Component Value Range   Yeast Wet Prep HPF POC NONE SEEN  NONE SEEN    Trich, Wet Prep NONE SEEN  NONE SEEN    Clue Cells Wet Prep HPF POC NONE SEEN  NONE SEEN    WBC, Wet Prep HPF POC MODERATE (*) NONE SEEN    No results found.    Lower back pain    MDM  Patient here with MSK lower back pain - I have taken over for C. Mayford Knife, PA-C - please see her note for HPI, ROS, PE - labs reveal no abnormalities - doubt UTI - contaminated specimen - will give short course of pain medication and muscle relaxer.        Izola Price [place], [place] 02/09/12 2021

## 2012-02-09 NOTE — ED Notes (Signed)
Pt reports L lower back pain x 3 days-pt reports calling her OB-GYN, states she was instructed to come to the ED for possible ruptured ovarian cyst.  Pt denies any pelvic pain at this time.  Pt denies urinary sxs.  Pt reports recently having a baby x 2 months ago.  Had a mirena placed x 3 weeks ago.  Pt reports nausea but denies any vomiting at this time.

## 2012-02-09 NOTE — Discharge Instructions (Signed)
Back Exercises Back exercises help treat and prevent back injuries. The goal of back exercises is to increase the strength of your abdominal and back muscles and the flexibility of your back. These exercises should be started when you no longer have back pain. Back exercises include:  Pelvic Tilt. Lie on your back with your knees bent. Tilt your pelvis until the lower part of your back is against the floor. Hold this position 5 to 10 sec and repeat 5 to 10 times.   Knee to Chest. Pull first 1 knee up against your chest and hold for 20 to 30 seconds, repeat this with the other knee, and then both knees. This may be done with the other leg straight or bent, whichever feels better.   Sit-Ups or Curl-Ups. Bend your knees 90 degrees. Start with tilting your pelvis, and do a partial, slow sit-up, lifting your trunk only 30 to 45 degrees off the floor. Take at least 2 to 3 seconds for each sit-up. Do not do sit-ups with your knees out straight. If partial sit-ups are difficult, simply do the above but with only tightening your abdominal muscles and holding it as directed.   Hip-Lift. Lie on your back with your knees flexed 90 degrees. Push down with your feet and shoulders as you raise your hips a couple inches off the floor; hold for 10 seconds, repeat 5 to 10 times.   Back arches. Lie on your stomach, propping yourself up on bent elbows. Slowly press on your hands, causing an arch in your low back. Repeat 3 to 5 times. Any initial stiffness and discomfort should lessen with repetition over time.   Shoulder-Lifts. Lie face down with arms beside your body. Keep hips and torso pressed to floor as you slowly lift your head and shoulders off the floor.  Do not overdo your exercises, especially in the beginning. Exercises may cause you some mild back discomfort which lasts for a few minutes; however, if the pain is more severe, or lasts for more than 15 minutes, do not continue exercises until you see your  caregiver. Improvement with exercise therapy for back problems is slow.  See your caregivers for assistance with developing a proper back exercise program. Document Released: 01/12/2005 Document Revised: 08/03/2011 Document Reviewed: 12/05/2005 [employer] Patient Information 2012 [employer].Back Pain, Adult Low back pain is very common. About 1 in 5 people have back pain.The cause of low back pain is rarely dangerous. The pain often gets better over time.About half of people with a sudden onset of back pain feel better in just 2 weeks. About 8 in 10 people feel better by 6 weeks.  CAUSES Some common causes of back pain include:  Strain of the muscles or ligaments supporting the spine.   Wear and tear (degeneration) of the spinal discs.   Arthritis.   Direct injury to the back.  DIAGNOSIS Most of the time, the direct cause of low back pain is not known.However, back pain can be treated effectively even when the exact cause of the pain is unknown.Answering your caregiver's questions about your overall health and symptoms is one of the most accurate ways to make sure the cause of your pain is not dangerous. If your caregiver needs more information, he or she may order lab work or imaging tests (X-rays or MRIs).However, even if imaging tests show changes in your back, this usually does not require surgery. HOME CARE INSTRUCTIONS For many people, back pain returns.Since low back pain is rarely   dangerous, it is often a condition that people can learn to manageon their own.   Remain active. It is stressful on the back to sit or stand in one place. Do not sit, drive, or stand in one place for more than 30 minutes at a time. Take short walks on level surfaces as soon as pain allows.Try to increase the length of time you walk each day.   Do not stay in bed.Resting more than 1 or 2 days can delay your recovery.   Do not avoid exercise or work.Your body is made to move.It is not dangerous  to be active, even though your back may hurt.Your back will likely heal faster if you return to being active before your pain is gone.   Pay attention to your body when you bend and lift. Many people have less discomfortwhen lifting if they bend their knees, keep the load close to their bodies,and avoid twisting. Often, the most comfortable positions are those that put less stress on your recovering back.   Find a comfortable position to sleep. Use a firm mattress and lie on your side with your knees slightly bent. If you lie on your back, put a pillow under your knees.   Only take over-the-counter or prescription medicines as directed by your caregiver. Over-the-counter medicines to reduce pain and inflammation are often the most helpful.Your caregiver may prescribe muscle relaxant drugs.These medicines help dull your pain so you can more quickly return to your normal activities and healthy exercise.   Put ice on the injured area.   Put ice in a plastic bag.   Place a towel between your skin and the bag.   Leave the ice on for 15 to 20 minutes, 3 to 4 times a day for the first 2 to 3 days. After that, ice and heat may be alternated to reduce pain and spasms.   Ask your caregiver about trying back exercises and gentle massage. This may be of some benefit.   Avoid feeling anxious or stressed.Stress increases muscle tension and can worsen back pain.It is important to recognize when you are anxious or stressed and learn ways to manage it.Exercise is a great option.  SEEK MEDICAL CARE IF:  You have pain that is not relieved with rest or medicine.   You have pain that does not improve in 1 week.   You have new symptoms.   You are generally not feeling well.  SEEK IMMEDIATE MEDICAL CARE IF:   You have pain that radiates from your back into your legs.   You develop new bowel or bladder control problems.   You have unusual weakness or numbness in your arms or legs.   You develop  nausea or vomiting.   You develop abdominal pain.   You feel faint.  Document Released: 12/05/2005 Document Revised: 08/17/2011 Document Reviewed: 04/25/2011 [employer] Patient Information 2012 [employer]. 

## 2012-02-09 NOTE — ED Provider Notes (Signed)
Medical screening examination/treatment/procedure(s) were performed by non-physician practitioner and as supervising physician I was immediately available for consultation/collaboration. Devoria Albe, MD, Armando Gang   Ward Givens, MD 02/09/12 [phone]

## 2012-02-09 NOTE — ED Provider Notes (Signed)
Medical screening examination/treatment/procedure(s) were performed by non-physician practitioner and as supervising physician I was immediately available for consultation/collaboration. Devoria Albe, MD, Armando Gang   Ward Givens, MD 02/09/12 [phone]

## 2012-02-09 NOTE — ED Provider Notes (Signed)
History     CSN: [id number]  Arrival date & time 02/09/12  1555   First MD Initiated Contact with Patient 02/09/12 1638      Chief Complaint  Patient presents with  . Back Pain    L lowerback    (Consider location/radiation/quality/duration/timing/severity/associated sxs/prior treatment) Patient is a 31 y.o. female presenting with back pain. The history is provided by the patient.  Back Pain    Patient presents with left low back pain, which he states started 3 days ago. It is described as aching in nature and worsens with any movement. She recently had a baby in December 2012, and states that she is unable to care for the child because of her pain. States that she has had trouble with her back in the past, but has not had anything like this before. She has tried some of her mom's Neurontin for the pain, but this has not helped. She has not tried ice or heat to the area or stretching exercises. She denies urinary symptoms or vaginal discharge. She had a Mirena IUD inserted approximately 3 weeks ago, and states she's had occasional vaginal spotting since that time. Denies abdominal pain. She's not had any nausea, vomiting, diarrhea. Denies fever, chills. She states that she called her OB/GYN today, and was instructed to come to the ED for further evaluation.   Past Medical History  Diagnosis Date  . Hypertension   . Cholestasis of pregnancy   . 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 Wisdom tooth extraction   . No past surgeries     Family History  Problem Relation Age of Onset  . Arthritis Mother   . Asthma Mother   . Depression Mother   . Hypertension Mother   . Miscarriages / Stillbirths Maternal Aunt   . Stroke Paternal Uncle   . Heart disease Paternal Uncle   . Cancer Maternal Grandmother   . Cancer Maternal Grandfather   . Cancer Paternal Grandmother   . Heart disease Paternal Grandmother   . Cancer Paternal  Grandfather     History  Substance Use Topics  . Smoking status: Former Games developer  . Smokeless tobacco: Not on file  . Alcohol Use: Yes    OB History    Grav Para Term Preterm Abortions TAB SAB Ect Mult Living   1 1 1  0 0 0 0 0 0 1      Review of Systems  Musculoskeletal: Positive for back pain.  All other systems reviewed and are negative.    Allergies  Penicillins  Home Medications   Current Outpatient Rx  Name Route Sig Dispense Refill  . GABAPENTIN 300 MG PO CAPS Oral Take 300 mg by mouth 3 (three) times daily.      BP 120/79  Pulse 88  Temp(Src) 98.1 F (36.7 C) (Oral)  Resp 20  SpO2 100%  Physical Exam  Nursing note and vitals reviewed. Constitutional: She is oriented to person, place, and time. She appears well-developed and well-nourished. No distress.  HENT:  Head: Normocephalic and atraumatic.  Eyes: EOM are normal. Pupils are equal, round, and reactive to light.  Neck: Normal range of motion. Neck supple. No spinous process tenderness and no muscular tenderness present. No rigidity.  Cardiovascular: Normal rate, regular rhythm and normal heart sounds.  Exam reveals no gallop and no friction rub.   No murmur heard. Pulmonary/Chest: Effort normal and breath  sounds normal. She exhibits no tenderness.  Abdominal: Soft. Bowel sounds are normal. There is no rebound and no guarding.       Slight tenderness to palpation over suprapubic area at the midline as well as right pelvis. There is no rebound or guarding.  Genitourinary:       Negative CVA tenderness  RN chaperone present during GU exam. Small amount of blood noted in vaginal vault. IUD "strings" visualized. Negative CMT. Minimal tenderness over R adnexal area without rebound. No masses appreciated.  Musculoskeletal:       Spine: No palpable stepoff, crepitus, or gross deformity appreciated. No midline tenderness. There is spasm of the left paravertebral muscles at approximately the level of L1.     Neurological: She is alert and oriented to person, place, and time.  Skin: Skin is warm and dry. No rash noted. She is not diaphoretic.  Psychiatric: She has a normal mood and affect.    ED Course  Procedures (including critical care time)  Labs Reviewed  URINALYSIS, ROUTINE W REFLEX MICROSCOPIC - Abnormal; Notable for the following:    APPearance CLOUDY (*)    Hgb urine dipstick LARGE (*)    Ketones, ur TRACE (*)    Leukocytes, UA LARGE (*)    All other components within normal limits  URINE MICROSCOPIC-ADD ON - Abnormal; Notable for the following:    Squamous Epithelial / LPF MANY (*)    All other components within normal limits  PREGNANCY, URINE  WET PREP, GENITAL  GC/CHLAMYDIA PROBE AMP, GENITAL  Patient states that she has had some vaginal spotting since her Mirena insertion. She states that she did notice some blood while giving the urine sample, which likely accounts for the large hemoglobin.  No results found.   No diagnosis found.    MDM  Pt with L back pain x 3 days which worsens with movement. This seems to be MSK in nature. She was given Toradol. UA with large leukocytes. She has slight tenderness to suprapubic area at midline and R. No rebound. Exam unremarkable for GU pathology. Await wet prep for dispo. Care handed off to [place], [place] who will dispo as appropriate.        Kiara Dillon, [place] 02/09/12 1907

## 2012-02-10 LAB — GC/CHLAMYDIA PROBE AMP, GENITAL
Chlamydia, DNA Probe: NEGATIVE
GC Probe Amp, Genital: NEGATIVE

## 2012-02-11 LAB — URINE CULTURE: Culture  Setup Time: 201302220345

## 2012-04-30 ENCOUNTER — Emergency Department (HOSPITAL_COMMUNITY)
Admission: EM | Admit: 2012-04-30 | Discharge: 2012-05-01 | Disposition: A | Discharge status: Home / Self Care | Payer: Self-pay | Attending: Emergency Medicine | Admitting: Emergency Medicine

## 2012-04-30 ENCOUNTER — Encounter (HOSPITAL_COMMUNITY): Payer: Self-pay | Admitting: Emergency Medicine

## 2012-04-30 DIAGNOSIS — B9689 Other specified bacterial agents as the cause of diseases classified elsewhere: Secondary | ICD-10-CM | POA: Insufficient documentation

## 2012-04-30 DIAGNOSIS — Z79899 Other long term (current) drug therapy: Secondary | ICD-10-CM | POA: Insufficient documentation

## 2012-04-30 DIAGNOSIS — I10 Essential (primary) hypertension: Secondary | ICD-10-CM | POA: Insufficient documentation

## 2012-04-30 DIAGNOSIS — R109 Unspecified abdominal pain: Secondary | ICD-10-CM | POA: Insufficient documentation

## 2012-04-30 DIAGNOSIS — A499 Bacterial infection, unspecified: Secondary | ICD-10-CM | POA: Insufficient documentation

## 2012-04-30 DIAGNOSIS — M545 Low back pain, unspecified: Secondary | ICD-10-CM | POA: Insufficient documentation

## 2012-04-30 DIAGNOSIS — N760 Acute vaginitis: Secondary | ICD-10-CM | POA: Insufficient documentation

## 2012-04-30 LAB — URINALYSIS, ROUTINE W REFLEX MICROSCOPIC
Bilirubin Urine: NEGATIVE
Hgb urine dipstick: NEGATIVE
Specific Gravity, Urine: 1.019 (ref 1.005–1.030)
pH: 5.5 (ref 5.0–8.0)

## 2012-04-30 LAB — URINE MICROSCOPIC-ADD ON

## 2012-04-30 NOTE — ED Notes (Signed)
Pt alert, nad, c/o low back pain, abd cramping, onset a few months ago, pt believes it may be her vaginal implant, + discharge, resp even unlabored, skin pwd

## 2012-04-30 NOTE — ED Provider Notes (Signed)
History     CSN: [id number]  Arrival date & time 04/30/12  1840   None     Chief Complaint  Patient presents with  . Back Pain  . Abdominal Cramping    (Consider location/radiation/quality/duration/timing/severity/associated sxs/prior treatment) HPI Comments: Patient states she's had low back pain since Mirena placed.  The end of December after her delivery.  She is had intermittent vaginal discharge.  Last being the end of April.  She was seen in the emergency room and was tested with negative results.  She was given medicine for her low back pain, which he states has not helped.  She presents today with a 45 day history of vaginal discharge and continued low back pain.    Patient is a 31 y.o. female presenting with back pain and cramps. The history is provided by the patient.  Back Pain  This is a chronic problem. The problem has been gradually worsening. The pain is associated with no known injury. The pain is present in the lumbar spine. The pain does not radiate. The pain is mild. The symptoms are aggravated by certain positions. Pertinent negatives include no fever and no dysuria.  Abdominal Cramping The primary symptoms of the illness include vaginal discharge. The primary symptoms of the illness do not include fever, dysuria or vaginal bleeding.  The vaginal discharge is not associated with dysuria.  Additional symptoms associated with the illness include back pain. Symptoms associated with the illness do not include chills.    Past Medical History  Diagnosis Date  . Hypertension   . Cholestasis of pregnancy   . 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 Wisdom tooth extraction   . No past surgeries     Family History  Problem Relation Age of Onset  . Arthritis Mother   . Asthma Mother   . Depression Mother   . Hypertension Mother   . Miscarriages / Stillbirths Maternal Aunt   . Stroke Paternal Uncle   .  Heart disease Paternal Uncle   . Cancer Maternal Grandmother   . Cancer Maternal Grandfather   . Cancer Paternal Grandmother   . Heart disease Paternal Grandmother   . Cancer Paternal Grandfather     History  Substance Use Topics  . Smoking status: Former Games developer  . Smokeless tobacco: Not on file  . Alcohol Use: Yes    OB History    Grav Para Term Preterm Abortions TAB SAB Ect Mult Living   1 1 1  0 0 0 0 0 0 1      Review of Systems  Constitutional: Negative for fever and chills.  Genitourinary: Positive for vaginal discharge. Negative for dysuria, flank pain and vaginal bleeding.  Musculoskeletal: Positive for back pain.  Skin: Negative for rash.    Allergies  Penicillins  Home Medications   Current Outpatient Rx  Name Route Sig Dispense Refill  . IBUPROFEN 400 MG PO TABS Oral Take 400 mg by mouth every 6 (six) hours as needed. For pain relief    . METRONIDAZOLE 500 MG PO TABS Oral Take 1 tablet (500 mg total) by mouth 2 (two) times daily. 14 tablet 0  . TRAMADOL HCL 50 MG PO TABS Oral Take 1 tablet (50 mg total) by mouth every 6 (six) hours as needed for pain. 15 tablet 0    BP 109/68  Pulse 77  Temp(Src) 98.5 F (36.9 C) (Oral)  Resp 22  Wt 206 lb (93.441 kg)  SpO2 100%  Physical Exam  Genitourinary: Vagina normal and uterus normal. Cervix exhibits no discharge and no friability. No vaginal discharge found.       IUD strings in place    ED Course  Procedures (including critical care time)  Labs Reviewed  URINALYSIS, ROUTINE W REFLEX MICROSCOPIC - Abnormal; Notable for the following:    Leukocytes, UA SMALL (*)    All other components within normal limits  URINE MICROSCOPIC-ADD ON - Abnormal; Notable for the following:    Squamous Epithelial / LPF FEW (*)    Bacteria, UA FEW (*)    All other components within normal limits  WET PREP, GENITAL - Abnormal; Notable for the following:    Clue Cells Wet Prep HPF POC TOO NUMEROUS TO COUNT (*)    WBC, Wet  Prep HPF POC TOO NUMEROUS TO COUNT (*)    All other components within normal limits  PREGNANCY, URINE  GC/CHLAMYDIA PROBE AMP, GENITAL   No results found.   1. Bacterial vaginosis   2. Low back pain       MDM  Patient is concerned that her IUD is not in the right, place, because she's had chronic low back pain since its been placed.  She also states, that she's never has a discharge, and she's noticed a slight discharge intermittently since the IUD was placed to 5 weeks after her child was born.  She is no longer a patient of Dr. Dareen Piano since delivery and has no OB/GYN doctor        Arman Filter, NP 05/01/12 [phone]

## 2012-05-01 LAB — WET PREP, GENITAL
Trich, Wet Prep: NONE SEEN
Yeast Wet Prep HPF POC: NONE SEEN

## 2012-05-01 LAB — GC/CHLAMYDIA PROBE AMP, GENITAL: Chlamydia, DNA Probe: NEGATIVE

## 2012-05-01 MED ORDER — KETOROLAC TROMETHAMINE 30 MG/ML IJ SOLN
30.0000 mg | Freq: Once | INTRAMUSCULAR | Status: AC
  Administered 2012-05-01: 30 mg via INTRAMUSCULAR
  Filled 2012-05-01: qty 1

## 2012-05-01 MED ORDER — TRAMADOL HCL 50 MG PO TABS: 50.0000 mg | ORAL_TABLET | Freq: Four times a day (QID) | ORAL | Status: AC | PRN

## 2012-05-01 MED ORDER — METRONIDAZOLE 500 MG PO TABS: 500.0000 mg | ORAL_TABLET | Freq: Two times a day (BID) | ORAL | Status: AC

## 2012-05-01 MED ORDER — METRONIDAZOLE 500 MG PO TABS
500.0000 mg | ORAL_TABLET | Freq: Once | ORAL | Status: AC
  Administered 2012-05-01: 500 mg via ORAL
  Filled 2012-05-01: qty 1

## 2012-05-01 NOTE — ED Provider Notes (Signed)
Medical screening examination/treatment/procedure(s) were performed by non-physician practitioner and as supervising physician I was immediately available for consultation/collaboration.   Gerhard Munch, MD 05/01/12 2330

## 2012-05-01 NOTE — Discharge Instructions (Signed)
You have been given a prescription for Flagyl to treat her bacterial vaginosis.  Please take this as directed until completed if also been given a prescription for Ultram for your low back pain.  I suspect that your pain is related to the infection is also be given a referral to Dr. Estanislado Pandy at [facility].  Please make an appointment for further evaluation

## 2012-08-08 ENCOUNTER — Emergency Department (HOSPITAL_COMMUNITY)
Admission: EM | Admit: 2012-08-08 | Discharge: 2012-08-09 | Disposition: A | Discharge status: Home / Self Care | Payer: Self-pay | Attending: Emergency Medicine | Admitting: Emergency Medicine

## 2012-08-08 ENCOUNTER — Encounter (HOSPITAL_COMMUNITY): Payer: Self-pay | Admitting: Emergency Medicine

## 2012-08-08 DIAGNOSIS — Z87891 Personal history of nicotine dependence: Secondary | ICD-10-CM | POA: Insufficient documentation

## 2012-08-08 DIAGNOSIS — I10 Essential (primary) hypertension: Secondary | ICD-10-CM | POA: Insufficient documentation

## 2012-08-08 DIAGNOSIS — N949 Unspecified condition associated with female genital organs and menstrual cycle: Secondary | ICD-10-CM | POA: Insufficient documentation

## 2012-08-08 DIAGNOSIS — R102 Pelvic and perineal pain: Secondary | ICD-10-CM

## 2012-08-08 DIAGNOSIS — R1032 Left lower quadrant pain: Secondary | ICD-10-CM | POA: Insufficient documentation

## 2012-08-08 LAB — WET PREP, GENITAL
Clue Cells Wet Prep HPF POC: NONE SEEN
Trich, Wet Prep: NONE SEEN
Yeast Wet Prep HPF POC: NONE SEEN

## 2012-08-08 LAB — CBC
MCHC: 35.1 g/dL (ref 30.0–36.0)
MCV: 85 fL (ref 78.0–100.0)
Platelets: 217 10*3/uL (ref 150–400)
RDW: 12.9 % (ref 11.5–15.5)
WBC: 9 10*3/uL (ref 4.0–10.5)

## 2012-08-08 LAB — URINALYSIS, ROUTINE W REFLEX MICROSCOPIC
Bilirubin Urine: NEGATIVE
Nitrite: NEGATIVE
Specific Gravity, Urine: 1.037 — ABNORMAL HIGH (ref 1.005–1.030)
pH: 6.5 (ref 5.0–8.0)

## 2012-08-08 LAB — COMPREHENSIVE METABOLIC PANEL
AST: 20 U/L (ref 0–37)
Albumin: 4.4 g/dL (ref 3.5–5.2)
BUN: 17 mg/dL (ref 6–23)
Chloride: 103 mEq/L (ref 96–112)
Creatinine, Ser: 0.72 mg/dL (ref 0.50–1.10)
Total Bilirubin: 0.2 mg/dL — ABNORMAL LOW (ref 0.3–1.2)
Total Protein: 7.2 g/dL (ref 6.0–8.3)

## 2012-08-08 LAB — PREGNANCY, URINE: Preg Test, Ur: NEGATIVE

## 2012-08-08 MED ORDER — MORPHINE SULFATE 4 MG/ML IJ SOLN
4.0000 mg | Freq: Once | INTRAMUSCULAR | Status: AC
  Administered 2012-08-08: 4 mg via INTRAVENOUS
  Filled 2012-08-08: qty 1

## 2012-08-08 NOTE — ED Provider Notes (Signed)
History     CSN: [id number]  Arrival date & time 08/08/12  Kiara Dillon   First MD Initiated Contact with Patient 08/08/12 2107      Chief Complaint  Patient presents with  . Pelvic Pain    (Consider location/radiation/quality/duration/timing/severity/associated sxs/prior treatment) HPI Comments: Kiara Dillon presents for evaluation of left lowe abdominal pain.  She states she has had similar discomfort previously and believes it is secondary to the mirena IUD she had placed almost 8 mo ago.  She describes a cramping sharp left-sided pain that extends to her left flank.  She denies any fevers, NVD, cough, SOB, constipation.  She states she is having a greater vaginal discharge than normal but denies dysuria, pyuria, hematuria, or vaginal bleeding.  She reports the pain is exacerbated by moving.  Patient is a 31 y.o. female presenting with pelvic pain. The history is provided by the patient. No language interpreter was used.  Pelvic Pain This is a new problem. The current episode started yesterday. The problem occurs constantly. The problem has not changed since onset.Associated symptoms include abdominal pain. Pertinent negatives include no chest pain, no headaches and no shortness of breath. The symptoms are aggravated by walking, bending and twisting. The symptoms are relieved by position and rest. She has tried nothing for the symptoms. The treatment provided no relief.    Past Medical History  Diagnosis Date  . Hypertension   . Cholestasis of pregnancy   . 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 Wisdom tooth extraction   . No past surgeries     Family History  Problem Relation Age of Onset  . Arthritis Mother   . Asthma Mother   . Depression Mother   . Hypertension Mother   . Miscarriages / Stillbirths Maternal Aunt   . Stroke Paternal Uncle   . Heart disease Paternal Uncle   . Cancer Maternal Grandmother   . Cancer  Maternal Grandfather   . Cancer Paternal Grandmother   . Heart disease Paternal Grandmother   . Cancer Paternal Grandfather     History  Substance Use Topics  . Smoking status: Former Games developer  . Smokeless tobacco: Not on file  . Alcohol Use: Yes    OB History    Grav Para Term Preterm Abortions TAB SAB Ect Mult Living   1 1 1  0 0 0 0 0 0 1      Review of Systems  Constitutional: Negative.   HENT: Negative.   Eyes: Negative.   Respiratory: Negative for cough, choking, chest tightness, shortness of breath, wheezing and stridor.   Cardiovascular: Negative for chest pain.  Gastrointestinal: Positive for abdominal pain. Negative for nausea, vomiting, diarrhea, constipation, blood in stool, abdominal distention, anal bleeding and rectal pain.  Genitourinary: Positive for flank pain, vaginal discharge, menstrual problem and pelvic pain. Negative for dysuria, urgency, frequency, hematuria, decreased urine volume, vaginal bleeding, enuresis, difficulty urinating, genital sores, vaginal pain and dyspareunia.  Musculoskeletal: Negative.   Skin: Negative.   Neurological: Negative for dizziness, tremors, weakness, light-headedness, numbness and headaches.  Hematological: Negative.   Psychiatric/Behavioral: Negative.     Allergies  Penicillins  Home Medications   Current Outpatient Rx  Name Route Sig Dispense Refill  . NAPROXEN SODIUM 220 MG PO TABS Oral Take 440 mg by mouth once.      BP 108/61  Pulse 68  Temp 98.4 F (36.9 C) (Oral)  Resp  20  Ht 5\' 2"  (1.575 m)  Wt 189 lb 5 oz (85.872 kg)  BMI 34.63 kg/m2  SpO2 98%  Physical Exam  Nursing note and vitals reviewed. Constitutional: She is oriented to person, place, and time. She appears well-developed and well-nourished. No distress.  HENT:  Head: Normocephalic and atraumatic.  Right Ear: External ear normal.  Left Ear: External ear normal.  Nose: Nose normal.  Mouth/Throat: Oropharynx is clear and moist. No  oropharyngeal exudate.  Eyes: Conjunctivae and EOM are normal. Pupils are equal, round, and reactive to light. Right eye exhibits no discharge. Left eye exhibits no discharge. No scleral icterus.  Neck: Normal range of motion. Neck supple. No JVD present. No tracheal deviation present. No thyromegaly present.  Cardiovascular: Normal rate, regular rhythm, normal heart sounds and intact distal pulses.  Exam reveals no gallop and no friction rub.   No murmur heard. Pulmonary/Chest: Effort normal and breath sounds normal. No stridor. No respiratory distress. She has no wheezes. She has no rales.  Abdominal: Soft. Normal appearance and normal aorta. There is no hepatosplenomegaly. There is tenderness in the left lower quadrant. There is guarding. There is no rigidity, no rebound, no CVA tenderness, no tenderness at McBurney's point and negative Murphy's sign. No hernia. Hernia confirmed negative in the right inguinal area and confirmed negative in the left inguinal area.  Genitourinary: Uterus normal. Rectal exam shows no external hemorrhoid. No labial fusion. There is no rash, tenderness, lesion or injury on the right labia. There is no rash, tenderness, lesion or injury on the left labia. Uterus is not deviated, not enlarged, not fixed and not tender. Cervix exhibits discharge. Cervix exhibits no motion tenderness and no friability. Right adnexum displays no mass, no tenderness and no fullness. Left adnexum displays tenderness. Left adnexum displays no mass and no fullness. No erythema, tenderness or bleeding around the vagina. No foreign body around the vagina. No signs of injury around the vagina. Vaginal discharge found.  Musculoskeletal: Normal range of motion. She exhibits no edema and no tenderness.  Lymphadenopathy:    She has no cervical adenopathy.       Right: No inguinal adenopathy present.       Left: No inguinal adenopathy present.  Neurological: She is alert and oriented to person, place, and  time. No cranial nerve deficit. Coordination normal.  Skin: Skin is warm and dry. No rash noted. She is not diaphoretic. No erythema. No pallor.  Psychiatric: She has a normal mood and affect. Her behavior is normal. Judgment and thought content normal.    ED Course  Procedures (including critical care time)  Labs Reviewed  URINALYSIS, ROUTINE W REFLEX MICROSCOPIC - Abnormal; Notable for the following:    APPearance CLOUDY (*)     Specific Gravity, Urine 1.037 (*)     All other components within normal limits  PREGNANCY, URINE  CBC  COMPREHENSIVE METABOLIC PANEL  GC/CHLAMYDIA PROBE AMP, GENITAL  WET PREP, GENITAL   No results found.   No diagnosis found.    MDM  Pt presents for evaluation of abdominal pain.  She has no hematuria or UTI on U/A and is not pregnant.  Plan routine belly labs and a pelvic exam.  Will review results as available, provide symptomatic care and add imaging as appropriate.  2325.  Pt still stable, NAD.  Significant reproducible left adnexal tenderness on exam.  Plan U/S to assess for malpositioning of IUD and for adnexal pathology.  Pt signed-over to oncoming provider.  U/S as yet to be performed.  NAD noted, stable VS.      Tobin Chad, MD 08/09/12 1510

## 2012-08-08 NOTE — ED Notes (Signed)
Pt alert, arrives from home, c/o pelvic pain, onset was chronic in nature, resp even unlabored, skin pwd, pt associates pain with IUD, requesting to have it removed

## 2012-08-09 ENCOUNTER — Emergency Department (HOSPITAL_COMMUNITY): Payer: Self-pay

## 2012-08-09 LAB — GC/CHLAMYDIA PROBE AMP, GENITAL
Chlamydia, DNA Probe: NEGATIVE
GC Probe Amp, Genital: NEGATIVE

## 2012-08-09 MED ORDER — IBUPROFEN 600 MG PO TABS: 600.0000 mg | ORAL_TABLET | Freq: Four times a day (QID) | ORAL | Status: AC | PRN

## 2012-08-09 NOTE — ED Notes (Signed)
Pt was informed there was a miscommunication with the ultrasound ordered.

## 2012-08-09 NOTE — ED Notes (Signed)
Pt reports having left pelvic pain since IUD was placed 8 months ago. Pt reports she was seen here prior for back pain and now the pain is more pelvic.

## 2013-04-11 ENCOUNTER — Emergency Department (HOSPITAL_BASED_OUTPATIENT_CLINIC_OR_DEPARTMENT_OTHER)
Admission: EM | Admit: 2013-04-11 | Discharge: 2013-04-11 | Disposition: A | Discharge status: Home / Self Care | Payer: Self-pay | Attending: Emergency Medicine | Admitting: Emergency Medicine

## 2013-04-11 ENCOUNTER — Encounter (HOSPITAL_BASED_OUTPATIENT_CLINIC_OR_DEPARTMENT_OTHER): Payer: Self-pay | Admitting: *Deleted

## 2013-04-11 DIAGNOSIS — N390 Urinary tract infection, site not specified: Secondary | ICD-10-CM | POA: Insufficient documentation

## 2013-04-11 DIAGNOSIS — I10 Essential (primary) hypertension: Secondary | ICD-10-CM | POA: Insufficient documentation

## 2013-04-11 DIAGNOSIS — Z3202 Encounter for pregnancy test, result negative: Secondary | ICD-10-CM | POA: Insufficient documentation

## 2013-04-11 DIAGNOSIS — Z880 Allergy status to penicillin: Secondary | ICD-10-CM | POA: Insufficient documentation

## 2013-04-11 DIAGNOSIS — Z87891 Personal history of nicotine dependence: Secondary | ICD-10-CM | POA: Insufficient documentation

## 2013-04-11 DIAGNOSIS — Z8619 Personal history of other infectious and parasitic diseases: Secondary | ICD-10-CM | POA: Insufficient documentation

## 2013-04-11 LAB — URINALYSIS, ROUTINE W REFLEX MICROSCOPIC
Bilirubin Urine: NEGATIVE
Hgb urine dipstick: NEGATIVE
Ketones, ur: NEGATIVE mg/dL
Nitrite: POSITIVE — AB
Protein, ur: NEGATIVE mg/dL
Specific Gravity, Urine: 1.031 — ABNORMAL HIGH (ref 1.005–1.030)
Urobilinogen, UA: 0.2 mg/dL (ref 0.0–1.0)

## 2013-04-11 LAB — WET PREP, GENITAL: Trich, Wet Prep: NONE SEEN

## 2013-04-11 LAB — URINE MICROSCOPIC-ADD ON

## 2013-04-11 LAB — GC/CHLAMYDIA PROBE AMP: CT Probe RNA: NEGATIVE

## 2013-04-11 MED ORDER — NITROFURANTOIN MONOHYD MACRO 100 MG PO CAPS
100.0000 mg | ORAL_CAPSULE | Freq: Once | ORAL | Status: AC
  Administered 2013-04-11: 100 mg via ORAL
  Filled 2013-04-11: qty 1

## 2013-04-11 MED ORDER — NITROFURANTOIN MONOHYD MACRO 100 MG PO CAPS: 100.0000 mg | ORAL_CAPSULE | Freq: Two times a day (BID) | ORAL | Status: DC

## 2013-04-11 NOTE — ED Notes (Signed)
C/o vaginal spotting since Wednesday night, scant amount. Urinary pressure at times. Denies fever.

## 2013-04-11 NOTE — ED Provider Notes (Addendum)
History     CSN: [id number]  Arrival date & time 04/11/13  0211   First MD Initiated Contact with Patient 04/11/13 0444      Chief Complaint  Patient presents with  . Vaginal Bleeding    (Consider location/radiation/quality/duration/timing/severity/associated sxs/prior treatment) HPI This is a 32 year old female who had some vaginal spotting yesterday evening. This alarmed her and she states she "never spots". The severity was minimal. There are no known modifying factors. She is having some mild suprapubic discomfort and urinary pressure. She denies fever or chills, nausea or vomiting.  Past Medical History  Diagnosis Date  . Hypertension   . Cholestasis of pregnancy   . 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 Wisdom tooth extraction    . No past surgeries      Family History  Problem Relation Age of Onset  . Arthritis Mother   . Asthma Mother   . Depression Mother   . Hypertension Mother   . Miscarriages / Stillbirths Maternal Aunt   . Stroke Paternal Uncle   . Heart disease Paternal Uncle   . Cancer Maternal Grandmother   . Cancer Maternal Grandfather   . Cancer Paternal Grandmother   . Heart disease Paternal Grandmother   . Cancer Paternal Grandfather     History  Substance Use Topics  . Smoking status: Former Games developer  . Smokeless tobacco: Not on file  . Alcohol Use: Yes    OB History   Grav Para Term Preterm Abortions TAB SAB Ect Mult Living   1 1 1  0 0 0 0 0 0 1      Review of Systems  All other systems reviewed and are negative.    Allergies  Penicillins  Home Medications   Current Outpatient Rx  Name  Route  Sig  Dispense  Refill  . naproxen sodium (ANAPROX) 220 MG tablet   Oral   Take 440 mg by mouth once.           BP 120/82  Pulse 112  Temp(Src) 98.5 F (36.9 C) (Oral)  Resp 20  Ht 5\' 2"  (1.575 m)  Wt 175 lb (79.379 kg)  BMI 32 kg/m2  SpO2 98%  LMP  03/26/2013  Breastfeeding? No  Physical Exam General: Well-developed, well-nourished female in no acute distress; appearance consistent with age of record HENT: normocephalic, atraumatic Eyes: pupils equal round and reactive to light; extraocular muscles intact Neck: supple Heart: regular rate and rhythm Lungs: clear to auscultation bilaterally Abdomen: soft; nondistended; mild suprapubic tenderness; no masses or hepatosplenomegaly; bowel sounds present GU: Normal external genitalia; no vaginal bleeding; physiologic appearing vaginal discharge; bladder tender Extremities: No deformity; full range of motion; pulses normal Neurologic: Awake, alert and oriented; motor function intact in all extremities and symmetric; no facial droop Skin: Warm and dry Psychiatric: Normal mood and affect    ED Course  Procedures (including critical care time)     MDM   Nursing notes and vitals signs, including pulse oximetry, reviewed.  Summary of this visit's results, reviewed by myself:  Labs:  Results for orders placed during the hospital encounter of 04/11/13 (from the past 24 hour(s))  URINALYSIS, ROUTINE W REFLEX MICROSCOPIC     Status: Abnormal   Collection Time    04/11/13  2:23 AM      Result Value Range   Color, Urine YELLOW  YELLOW   APPearance CLOUDY (*)  CLEAR   Specific Gravity, Urine 1.031 (*) 1.005 - 1.030   pH 6.0  5.0 - 8.0   Glucose, UA NEGATIVE  NEGATIVE mg/dL   Hgb urine dipstick NEGATIVE  NEGATIVE   Bilirubin Urine NEGATIVE  NEGATIVE   Ketones, ur NEGATIVE  NEGATIVE mg/dL   Protein, ur NEGATIVE  NEGATIVE mg/dL   Urobilinogen, UA 0.2  0.0 - 1.0 mg/dL   Nitrite POSITIVE (*) NEGATIVE   Leukocytes, UA SMALL (*) NEGATIVE  PREGNANCY, URINE     Status: None   Collection Time    04/11/13  2:23 AM      Result Value Range   Preg Test, Ur NEGATIVE  NEGATIVE  URINE MICROSCOPIC-ADD ON     Status: Abnormal   Collection Time    04/11/13  2:23 AM      Result Value Range    Squamous Epithelial / LPF RARE  RARE   WBC, UA 7-10  <3 WBC/hpf   RBC / HPF 0-2  <3 RBC/hpf   Bacteria, UA MANY (*) RARE  WET PREP, GENITAL     Status: Abnormal   Collection Time    04/11/13  4:48 AM      Result Value Range   Yeast Wet Prep HPF POC NONE SEEN  NONE SEEN   Trich, Wet Prep NONE SEEN  NONE SEEN   Clue Cells Wet Prep HPF POC FEW (*) NONE SEEN   WBC, Wet Prep HPF POC FEW (*) NONE SEEN            Hanley Seamen, MD 04/11/13 0455  Hanley Seamen, MD 04/11/13 [phone]

## 2013-04-12 LAB — URINE CULTURE: Colony Count: >=100000

## 2013-04-13 ENCOUNTER — Telehealth (HOSPITAL_COMMUNITY): Payer: Self-pay | Admitting: Emergency Medicine

## 2013-04-13 NOTE — ED Notes (Signed)
+  Urine. Patient treated with Macrobid. Sensitive to same. Per protocol MD. °

## 2013-04-13 NOTE — ED Notes (Signed)
Patient has +Urine culture. °

## 2013-04-16 NOTE — ED Notes (Signed)
Pt is here asking about a call she believes she received from the lab here regarding lab results she wa givn the flow managers number and I opened this chart to be sure there were no positive results that needed to be addressed and reviewed with the patient. Explained she does need to take the macrobid as directed and until all gone for treatment of UTI

## 2013-05-29 ENCOUNTER — Emergency Department (HOSPITAL_BASED_OUTPATIENT_CLINIC_OR_DEPARTMENT_OTHER)
Admission: EM | Admit: 2013-05-29 | Discharge: 2013-05-29 | Disposition: A | Discharge status: Home / Self Care | Payer: Self-pay | Attending: Emergency Medicine | Admitting: Emergency Medicine

## 2013-05-29 ENCOUNTER — Encounter (HOSPITAL_BASED_OUTPATIENT_CLINIC_OR_DEPARTMENT_OTHER): Payer: Self-pay | Admitting: *Deleted

## 2013-05-29 DIAGNOSIS — Z880 Allergy status to penicillin: Secondary | ICD-10-CM | POA: Insufficient documentation

## 2013-05-29 DIAGNOSIS — Z87891 Personal history of nicotine dependence: Secondary | ICD-10-CM | POA: Insufficient documentation

## 2013-05-29 DIAGNOSIS — R5381 Other malaise: Secondary | ICD-10-CM | POA: Insufficient documentation

## 2013-05-29 DIAGNOSIS — IMO0001 Reserved for inherently not codable concepts without codable children: Secondary | ICD-10-CM | POA: Insufficient documentation

## 2013-05-29 DIAGNOSIS — K529 Noninfective gastroenteritis and colitis, unspecified: Secondary | ICD-10-CM

## 2013-05-29 DIAGNOSIS — I10 Essential (primary) hypertension: Secondary | ICD-10-CM | POA: Insufficient documentation

## 2013-05-29 DIAGNOSIS — Z8619 Personal history of other infectious and parasitic diseases: Secondary | ICD-10-CM | POA: Insufficient documentation

## 2013-05-29 DIAGNOSIS — R197 Diarrhea, unspecified: Secondary | ICD-10-CM | POA: Insufficient documentation

## 2013-05-29 DIAGNOSIS — K5289 Other specified noninfective gastroenteritis and colitis: Secondary | ICD-10-CM | POA: Insufficient documentation

## 2013-05-29 LAB — URINALYSIS, ROUTINE W REFLEX MICROSCOPIC
Glucose, UA: NEGATIVE mg/dL
Ketones, ur: 15 mg/dL — AB
Protein, ur: NEGATIVE mg/dL
Urobilinogen, UA: 1 mg/dL (ref 0.0–1.0)

## 2013-05-29 LAB — BASIC METABOLIC PANEL
BUN: 11 mg/dL (ref 6–23)
CO2: 28 mEq/L (ref 19–32)
Calcium: 9.8 mg/dL (ref 8.4–10.5)
Chloride: 100 mEq/L (ref 96–112)
Creatinine, Ser: 0.9 mg/dL (ref 0.50–1.10)

## 2013-05-29 LAB — URINE MICROSCOPIC-ADD ON

## 2013-05-29 MED ORDER — SODIUM CHLORIDE 0.9 % IV BOLUS (SEPSIS)
1000.0000 mL | Freq: Once | INTRAVENOUS | Status: AC
  Administered 2013-05-29: 1000 mL via INTRAVENOUS

## 2013-05-29 MED ORDER — ONDANSETRON HCL 4 MG PO TABS: 4.0000 mg | ORAL_TABLET | Freq: Four times a day (QID) | ORAL | Status: DC

## 2013-05-29 MED ORDER — ONDANSETRON HCL 4 MG/2ML IJ SOLN
4.0000 mg | Freq: Once | INTRAMUSCULAR | Status: AC
  Administered 2013-05-29: 4 mg via INTRAVENOUS
  Filled 2013-05-29: qty 2

## 2013-05-29 NOTE — ED Provider Notes (Signed)
Medical screening examination/treatment/procedure(s) were performed by non-physician practitioner and as supervising physician I was immediately available for consultation/collaboration.   Gwyneth Sprout, MD 05/29/13 2325

## 2013-05-29 NOTE — ED Notes (Signed)
PA at bedside.

## 2013-05-29 NOTE — ED Notes (Signed)
Pt c/o n/v/d  X 2 days denies abd pain

## 2013-05-29 NOTE — ED Provider Notes (Signed)
History     CSN: [id number]  Arrival date & time 05/29/13  1503   First MD Initiated Contact with Patient 05/29/13 1648      Chief Complaint  Patient presents with  . Emesis  . Diarrhea  . Nausea  . Fatigue    (Consider location/radiation/quality/duration/timing/severity/associated sxs/prior treatment) Patient is a 32 y.o. female presenting with vomiting and diarrhea. The history is provided by the patient. No language interpreter was used.  Emesis Severity:  Moderate Associated symptoms: diarrhea and myalgias   Associated symptoms: no abdominal pain and no chills   Associated symptoms comment:  Nausea, vomiting and diarrhea since yesterday. No abdominal pain or fever. She denies hematemesis or melena. No sick contact.  Diarrhea Associated symptoms: myalgias and vomiting   Associated symptoms: no abdominal pain, no chills and no fever     Past Medical History  Diagnosis Date  . Hypertension   . Cholestasis of pregnancy   . 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 Wisdom tooth extraction    . No past surgeries      Family History  Problem Relation Age of Onset  . Arthritis Mother   . Asthma Mother   . Depression Mother   . Hypertension Mother   . Miscarriages / Stillbirths Maternal Aunt   . Stroke Paternal Uncle   . Heart disease Paternal Uncle   . Cancer Maternal Grandmother   . Cancer Maternal Grandfather   . Cancer Paternal Grandmother   . Heart disease Paternal Grandmother   . Cancer Paternal Grandfather     History  Substance Use Topics  . Smoking status: Former Games developer  . Smokeless tobacco: Not on file  . Alcohol Use: No    OB History   Grav Para Term Preterm Abortions TAB SAB Ect Mult Living   1 1 1  0 0 0 0 0 0 1      Review of Systems  Constitutional: Negative for fever and chills.  Respiratory: Negative for cough.   Gastrointestinal: Positive for vomiting and diarrhea.  Negative for abdominal pain and blood in stool.  Musculoskeletal: Positive for myalgias.  Skin: Negative.  Negative for rash.  Neurological: Negative.     Allergies  Penicillins  Home Medications  No current outpatient prescriptions on file.  BP 111/86  Pulse 84  Temp(Src) 97.9 F (36.6 C) (Oral)  Resp 16  Ht 5\' 2"  (1.575 m)  Wt 180 lb (81.647 kg)  BMI 32.91 kg/m2  SpO2 100%  LMP 05/19/2013  Physical Exam  Constitutional: She is oriented to person, place, and time. She appears well-developed and well-nourished.  HENT:  Head: Normocephalic.  Neck: Normal range of motion. Neck supple.  Cardiovascular: Normal rate and regular rhythm.   Pulmonary/Chest: Effort normal and breath sounds normal.  Abdominal: Soft. Bowel sounds are normal. There is no tenderness. There is no rebound and no guarding.  Musculoskeletal: Normal range of motion.  Neurological: She is alert and oriented to person, place, and time.  Skin: Skin is warm and dry. No rash noted.  Psychiatric: She has a normal mood and affect.    ED Course  Procedures (including critical care time)  Labs Reviewed  URINALYSIS, ROUTINE W REFLEX MICROSCOPIC - Abnormal; Notable for the following:    APPearance CLOUDY (*)    Ketones, ur 15 (*)    Leukocytes, UA SMALL (*)    All other components within  normal limits  URINE MICROSCOPIC-ADD ON - Abnormal; Notable for the following:    Squamous Epithelial / LPF MANY (*)    Bacteria, UA FEW (*)    All other components within normal limits  BASIC METABOLIC PANEL - Abnormal; Notable for the following:    GFR calc non Af Amer 85 (*)    All other components within normal limits  URINE CULTURE  PREGNANCY, URINE   Results for orders placed during the hospital encounter of 05/29/13  PREGNANCY, URINE      Result Value Range   Preg Test, Ur NEGATIVE  NEGATIVE  URINALYSIS, ROUTINE W REFLEX MICROSCOPIC      Result Value Range   Color, Urine YELLOW  YELLOW   APPearance CLOUDY (*)  CLEAR   Specific Gravity, Urine 1.025  1.005 - 1.030   pH 6.0  5.0 - 8.0   Glucose, UA NEGATIVE  NEGATIVE mg/dL   Hgb urine dipstick NEGATIVE  NEGATIVE   Bilirubin Urine NEGATIVE  NEGATIVE   Ketones, ur 15 (*) NEGATIVE mg/dL   Protein, ur NEGATIVE  NEGATIVE mg/dL   Urobilinogen, UA 1.0  0.0 - 1.0 mg/dL   Nitrite NEGATIVE  NEGATIVE   Leukocytes, UA SMALL (*) NEGATIVE  URINE MICROSCOPIC-ADD ON      Result Value Range   Squamous Epithelial / LPF MANY (*) RARE   WBC, UA 3-6  <3 WBC/hpf   RBC / HPF 3-6  <3 RBC/hpf   Bacteria, UA FEW (*) RARE   Urine-Other MUCOUS PRESENT    BASIC METABOLIC PANEL      Result Value Range   Sodium 139  135 - 145 mEq/L   Potassium 3.8  3.5 - 5.1 mEq/L   Chloride 100  96 - 112 mEq/L   CO2 28  19 - 32 mEq/L   Glucose, Bld 97  70 - 99 mg/dL   BUN 11  6 - 23 mg/dL   Creatinine, Ser 0.34  0.50 - 1.10 mg/dL   Calcium 9.8  8.4 - 74.2 mg/dL   GFR calc non Af Amer 85 (*) >90 mL/min   GFR calc Af Amer >90  >90 mL/min    No results found.   No diagnosis found. 1. Gastroenteritis    MDM  Tolerating PO fluids with vomiting. Abdomen benign. Patient rehydrated with IV fluids and feels improved. VSS - stable for discharge.        Arnoldo Hooker, PA-C 05/29/13 1851

## 2013-05-31 LAB — URINE CULTURE: Colony Count: >=100000

## 2013-06-01 ENCOUNTER — Telehealth (HOSPITAL_COMMUNITY): Payer: Self-pay | Admitting: Emergency Medicine

## 2013-06-01 NOTE — ED Notes (Signed)
Post ED Visit - Positive Culture Follow-up: Successful Patient Follow-Up  Culture assessed and recommendations reviewed by: []  Wes Dulaney, Pharm.D., BCPS []  Celedonio Miyamoto, Pharm.D., BCPS [x]  Georgina Pillion, Pharm.D., BCPS []  [place], [place].D., BCPS, AAHIVP []  Estella Husk, Pharm.D., BCPS, AAHIVP  Positive urine culture  [x]  Patient discharged without antimicrobial prescription and treatment is now indicated []  Organism is resistant to prescribed ED discharge antimicrobial []  Patient with positive blood cultures  Changes discussed with ED provider: Junious Silk PA-C New antibiotic prescription: Macrobid 100 mg bid x 7 days    Kiara Dillon 06/01/2013, 3:52 PM

## 2013-06-01 NOTE — Progress Notes (Signed)
ED Antimicrobial Stewardship Positive Culture Follow Up   Kiara Dillon is an 32 y.o. female who presented to [facility] on 05/29/2013 with a chief complaint of N/V/D  Chief Complaint  Patient presents with  . Emesis  . Diarrhea  . Nausea  . Fatigue    Recent Results (from the past 720 hour(s))  URINE CULTURE     Status: None   Collection Time    05/29/13  4:50 PM      Result Value Range Status   Specimen Description URINE, RANDOM   Final   Special Requests NONE   Final   Culture  Setup Time 05/29/2013 22:19   Final   Colony Count >=100,000 COLONIES/ML   Final   Culture ESCHERICHIA COLI   Final   Report Status 05/31/2013 FINAL   Final   Organism ID, Bacteria ESCHERICHIA COLI   Final    []  Treated with , organism resistant to prescribed antimicrobial [x]  Patient discharged originally without antimicrobial agent and treatment is now indicated  New antibiotic prescription: Macrobid 100 mg twice daily for 7 days  ED Provider: Junious Silk, PA-C   Kiara Dillon 06/01/2013, 2:21 PM Infectious Diseases Pharmacist Phone# [phone]

## 2013-06-02 ENCOUNTER — Telehealth (HOSPITAL_COMMUNITY): Payer: Self-pay | Admitting: Emergency Medicine

## 2013-06-03 ENCOUNTER — Telehealth (HOSPITAL_COMMUNITY): Payer: Self-pay | Admitting: Emergency Medicine

## 2013-06-04 ENCOUNTER — Telehealth (HOSPITAL_COMMUNITY): Payer: Self-pay | Admitting: Emergency Medicine

## 2013-06-09 DIAGNOSIS — B084 Enteroviral vesicular stomatitis with exanthem: Secondary | ICD-10-CM | POA: Insufficient documentation

## 2013-06-09 DIAGNOSIS — Z880 Allergy status to penicillin: Secondary | ICD-10-CM | POA: Insufficient documentation

## 2013-06-09 DIAGNOSIS — Z87891 Personal history of nicotine dependence: Secondary | ICD-10-CM | POA: Insufficient documentation

## 2013-06-09 DIAGNOSIS — IMO0001 Reserved for inherently not codable concepts without codable children: Secondary | ICD-10-CM | POA: Insufficient documentation

## 2013-06-09 DIAGNOSIS — Z8619 Personal history of other infectious and parasitic diseases: Secondary | ICD-10-CM | POA: Insufficient documentation

## 2013-06-09 DIAGNOSIS — Z87440 Personal history of urinary (tract) infections: Secondary | ICD-10-CM | POA: Insufficient documentation

## 2013-06-09 DIAGNOSIS — I10 Essential (primary) hypertension: Secondary | ICD-10-CM | POA: Insufficient documentation

## 2013-06-09 DIAGNOSIS — R5381 Other malaise: Secondary | ICD-10-CM | POA: Insufficient documentation

## 2013-06-10 ENCOUNTER — Emergency Department (HOSPITAL_COMMUNITY)
Admission: EM | Admit: 2013-06-10 | Discharge: 2013-06-10 | Disposition: A | Discharge status: Home / Self Care | Payer: Self-pay | Attending: Emergency Medicine | Admitting: Emergency Medicine

## 2013-06-10 ENCOUNTER — Encounter (HOSPITAL_COMMUNITY): Payer: Self-pay | Admitting: Adult Health

## 2013-06-10 DIAGNOSIS — B084 Enteroviral vesicular stomatitis with exanthem: Secondary | ICD-10-CM

## 2013-06-10 LAB — POCT I-STAT, CHEM 8
Calcium, Ion: 1.16 mmol/L (ref 1.12–1.23)
Hemoglobin: 13.9 g/dL (ref 12.0–15.0)
Sodium: 139 mEq/L (ref 135–145)
TCO2: 25 mmol/L (ref 0–100)

## 2013-06-10 LAB — CBC WITH DIFFERENTIAL/PLATELET
Basophils Absolute: 0.1 10*3/uL (ref 0.0–0.1)
Eosinophils Absolute: 0.6 10*3/uL (ref 0.0–0.7)
Lymphocytes Relative: 46 % (ref 12–46)
MCHC: 34.3 g/dL (ref 30.0–36.0)
Neutrophils Relative %: 38 % — ABNORMAL LOW (ref 43–77)
Platelets: 211 10*3/uL (ref 150–400)
RDW: 12.9 % (ref 11.5–15.5)

## 2013-06-10 LAB — RPR: RPR Ser Ql: NONREACTIVE

## 2013-06-10 MED ORDER — HYDROCODONE-ACETAMINOPHEN 5-325 MG PO TABS: 1.0000 | ORAL_TABLET | ORAL | Status: DC | PRN

## 2013-06-10 MED ORDER — KETOROLAC TROMETHAMINE 30 MG/ML IJ SOLN
30.0000 mg | Freq: Once | INTRAMUSCULAR | Status: AC
  Administered 2013-06-10: 30 mg via INTRAMUSCULAR
  Filled 2013-06-10: qty 1

## 2013-06-10 MED ORDER — BENZOCAINE 10 % MT GEL: OROMUCOSAL | Status: DC | PRN

## 2013-06-10 NOTE — ED Provider Notes (Signed)
  Medical screening examination/treatment/procedure(s) were performed by non-physician practitioner and as supervising physician I was immediately available for consultation/collaboration.    Gerhard Munch, MD 06/10/13 [phone]

## 2013-06-10 NOTE — ED Notes (Addendum)
Pt presents with blisters/rash to bilateral feet and hands and nausea. Reports that her daughter had hand, foot and mouth disease recently. Rash started Saturday. She reports pain, denies itchiness. Reports that she was sick one week ago and placed on an antibiotic for UTI.

## 2013-06-10 NOTE — ED Provider Notes (Signed)
History     CSN: [id number]  Arrival date & time 06/09/13  2346   First MD Initiated Contact with Patient 06/10/13 0034      Chief Complaint  Patient presents with  . Rash   HPI  History provided by the patient. Patient is a 32 year old female with history of hypertension who presents with complaints of painful rash to her hands and feet. Patient states she has been feeling ill for the past several days. She was seen at [facility] and during that time found to have a urinary tract infection. She was started on nitrofurantoin for this but continues to feel fatigued with some general myalgias. Yesterday she began to notice spots and rash to her palms and soles of her feet with tenderness. She reports that her young daughter was recently diagnosed with hand-foot-and-mouth disease she is unsure if she has picked up the same infection. She denies any pain or lesions in her mouth her tongue. She denied having any fever. There has been no vomiting or diarrhea. Denies any urinary complaints. No vaginal bleeding or discharge.    Past Medical History  Diagnosis Date  . Hypertension   . Cholestasis of pregnancy   . 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 Wisdom tooth extraction    . No past surgeries      Family History  Problem Relation Age of Onset  . Arthritis Mother   . Asthma Mother   . Depression Mother   . Hypertension Mother   . Miscarriages / Stillbirths Maternal Aunt   . Stroke Paternal Uncle   . Heart disease Paternal Uncle   . Cancer Maternal Grandmother   . Cancer Maternal Grandfather   . Cancer Paternal Grandmother   . Heart disease Paternal Grandmother   . Cancer Paternal Grandfather     History  Substance Use Topics  . Smoking status: Former Games developer  . Smokeless tobacco: Not on file  . Alcohol Use: No    OB History   Grav Para Term Preterm Abortions TAB SAB Ect Mult Living   1 1  1  0 0 0 0 0 0 1      Review of Systems  Constitutional: Positive for fatigue. Negative for fever and chills.  HENT: Negative for congestion and rhinorrhea.   Respiratory: Negative for cough.   Gastrointestinal: Negative for nausea and vomiting.  Skin: Positive for rash.  All other systems reviewed and are negative.    Allergies  Penicillins  Home Medications   Current Outpatient Rx  Name  Route  Sig  Dispense  Refill  . nitrofurantoin (MACRODANTIN) 100 MG capsule   Oral   Take 100 mg by mouth 2 (two) times daily. For 7 days -kidney infection           BP 151/83  Pulse 79  Temp(Src) 99.1 F (37.3 C) (Oral)  Resp 20  SpO2 97%  LMP 05/19/2013  Physical Exam  Nursing note and vitals reviewed. Constitutional: She is oriented to person, place, and time. She appears well-developed and well-nourished. No distress.  HENT:  Head: Normocephalic.  Mouth/Throat: Oropharynx is clear and moist.  No lesions or petechiae in the mouth or tongue.  Cardiovascular: Normal rate and regular rhythm.   Pulmonary/Chest: Effort normal and breath sounds normal.  Abdominal: Soft.  Musculoskeletal: Normal range of motion.  Neurological: She is alert and oriented to person, place,  and time.  Skin: Skin is warm and dry. Rash noted.  There is an erythematous macular papular rash to the palms of bilateral hands and the soles of the feet. Areas are tender to palpation. No vesicles.  Psychiatric: She has a normal mood and affect. Her behavior is normal.    ED Course  Procedures   Results for orders placed during the hospital encounter of 06/10/13  CBC WITH DIFFERENTIAL      Result Value Range   WBC 7.1  4.0 - 10.5 K/uL   RBC 4.74  3.87 - 5.11 MIL/uL   Hemoglobin 14.0  12.0 - 15.0 g/dL   HCT 16.1  09.6 - 04.5 %   MCV 86.1  78.0 - 100.0 fL   MCH 29.5  26.0 - 34.0 pg   MCHC 34.3  30.0 - 36.0 g/dL   RDW 40.9  81.1 - 91.4 %   Platelets 211  150 - 400 K/uL   Neutrophils Relative % 38 (*)  43 - 77 %   Lymphocytes Relative 46  12 - 46 %   Monocytes Relative 7  3 - 12 %   Eosinophils Relative 8 (*) 0 - 5 %   Basophils Relative 1  0 - 1 %   Neutro Abs 2.7  1.7 - 7.7 K/uL   Lymphs Abs 3.2  0.7 - 4.0 K/uL   Monocytes Absolute 0.5  0.1 - 1.0 K/uL   Eosinophils Absolute 0.6  0.0 - 0.7 K/uL   Basophils Absolute 0.1  0.0 - 0.1 K/uL   WBC Morphology ATYPICAL LYMPHOCYTES     Smear Review LARGE PLATELETS PRESENT    POCT I-STAT, CHEM 8      Result Value Range   Sodium 139  135 - 145 mEq/L   Potassium 3.7  3.5 - 5.1 mEq/L   Chloride 102  96 - 112 mEq/L   BUN 8  6 - 23 mg/dL   Creatinine, Ser 7.82  0.50 - 1.10 mg/dL   Glucose, Bld 80  70 - 99 mg/dL   Calcium, Ion 9.56  2.13 - 1.23 mmol/L   TCO2 25  0 - 100 mmol/L   Hemoglobin 13.9  12.0 - 15.0 g/dL   HCT 08.6  57.8 - 46.9 %       1. Hand, foot and mouth disease       MDM  Patient seen and evaluated. Patient appears well in no acute distress. Does not appear severely ill or toxic.        Angus Seller, PA-C 06/10/13 [phone]

## 2013-06-11 LAB — POCT I-STAT TROPONIN I: Troponin i, poc: 0 ng/mL (ref 0.00–0.08)

## 2013-09-27 ENCOUNTER — Inpatient Hospital Stay (HOSPITAL_COMMUNITY)
Admission: AD | Admit: 2013-09-27 | Discharge: 2013-09-27 | Disposition: A | Discharge status: Home / Self Care | Payer: Medicaid Other | Source: Ambulatory Visit | Attending: Obstetrics & Gynecology | Admitting: Obstetrics & Gynecology

## 2013-09-27 ENCOUNTER — Encounter (HOSPITAL_COMMUNITY): Payer: Self-pay | Admitting: *Deleted

## 2013-09-27 DIAGNOSIS — R21 Rash and other nonspecific skin eruption: Secondary | ICD-10-CM | POA: Insufficient documentation

## 2013-09-27 DIAGNOSIS — N644 Mastodynia: Secondary | ICD-10-CM | POA: Insufficient documentation

## 2013-09-27 MED ORDER — HYDROCODONE-ACETAMINOPHEN 5-325 MG PO TABS: 2.0000 | ORAL_TABLET | Freq: Four times a day (QID) | ORAL | Status: DC | PRN

## 2013-09-27 MED ORDER — KETOROLAC TROMETHAMINE 60 MG/2ML IM SOLN
60.0000 mg | Freq: Once | INTRAMUSCULAR | Status: AC
  Administered 2013-09-27: 60 mg via INTRAMUSCULAR
  Filled 2013-09-27: qty 2

## 2013-09-27 NOTE — MAU Provider Note (Signed)
History     CSN: [id number]  Arrival date and time: 09/27/13 1478   First Provider Initiated Contact with Patient 09/27/13 2034      Chief Complaint  Patient presents with  . Breast Pain   HPI Kiara Dillon 32 y.o. Comes to MAU with pain in right breast and under right arm.  Has mottling in color of skin on right breast.  [facility] and has an appointment with [facility] at the [facility] on Tuesday.  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  Is very worried and teary.   OB History   Grav Para Term Preterm Abortions TAB SAB Ect Mult Living   2 1 1  0 1 1 0 0 0 1      Past Medical History  Diagnosis Date  . Hypertension   . Cholestasis of pregnancy   . 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 Wisdom tooth extraction    . No past surgeries      Family History  Problem Relation Age of Onset  . Arthritis Mother   . Asthma Mother   . Depression Mother   . Hypertension Mother   . Miscarriages / Stillbirths Maternal Aunt   . Stroke Paternal Uncle   . Heart disease Paternal Uncle   . Cancer Maternal Grandmother   . Cancer Maternal Grandfather   . Cancer Paternal Grandmother   . Heart disease Paternal Grandmother   . Cancer Paternal Grandfather     History  Substance Use Topics  . Smoking status: Former Games developer  . Smokeless tobacco: Not on file  . Alcohol Use: No    Allergies:  Allergies  Allergen Reactions  . Penicillins Other (See Comments)    Patient states that reaction to this medication, is a childhood reaction.    Prescriptions prior to admission  Medication Sig Dispense Refill  . doxycycline (VIBRAMYCIN) 100 MG capsule Take 100 mg by mouth 2 (two) times daily.      . benzocaine (ORAJEL) 10 % mucosal gel Use as directed in the mouth or throat as needed for pain.  5.3 g  0  .  HYDROcodone-acetaminophen (NORCO) 5-325 MG per tablet Take 1 tablet by mouth every 4 (four) hours as needed for pain.  10 tablet  0    Review of Systems  Constitutional: Negative for fever.  Genitourinary:       Right breast pain and right underarm pain  Skin:       Changes in skin color above right breast on upper chest and shoulder   Physical Exam   Blood pressure 125/81, pulse 87, temperature 98.1 F (36.7 C), resp. rate 20, height 5\' 3"  (1.6 m), weight 166 lb 6.4 oz (75.479 kg), last menstrual period 09/07/2013.  Physical Exam  Nursing note and vitals reviewed. Constitutional: She is oriented to person, place, and time. She appears well-developed and well-nourished. She appears distressed.  HENT:  Head: Normocephalic.  Eyes: EOM are normal.  Neck: Neck supple.  Respiratory:  Breast exam - left breast - nontender, no masses Right breast tender on inner quadrants and at 9 oclock.  Diffuse thickening and dense area approx 4 cm noted at 9 oclock deep in breast, but this area is not as tender as the areas distal to this specific area in  the far outer edge of the breast at 9 oclock.  Right underarm very tender to touch.  No skin changes seen.  No nodules palpated.  Musculoskeletal: Normal range of motion.  Neurological: She is alert and oriented to person, place, and time.  Skin: Skin is warm and dry.  Skin color changes noted above right breast on upper chest and shoulder.  Irregular patches of light pink/Hair seemingly streaking from shoulder toward the breast.  Psychiatric: She has a normal mood and affect.    MAU Course  Procedures  MDM Discussed with client, all diagnostic evaluation will be done at [facility].  Will focus on pain management tonight.  Toradol 60 mg IM now and follow up with Ibuprofen OTC PO if this helps.  Will prescribe Vicodin in case Toradol does not help.   Assessment and Plan  Assessment Right Breast Pain  Plan See MDM Rx Vicodin 5/300 one to two tabs  PO q 4-6 hours.  Use lowest dose that helps.  Max 8 tabs per day. (#20) Ibuprofen OTC according to the package directions. Keep appointment on Tuesday at [facility]. BURLESON,TERRI 09/27/2013, 8:35 PM

## 2013-09-27 NOTE — MAU Note (Signed)
I've had R arm and R breast pain for a month. [facility] past [facility] And set up for appt [facility]. Also has rash around R upper chest and breast. R breast and underarm very tender. Was started on doxycycline

## 2013-09-27 NOTE — Progress Notes (Signed)
Ricke Hey in earlier and discussed plan of care and d/c plan. Written and verbal d/c instructions given and understanding voiced.

## 2013-09-30 ENCOUNTER — Other Ambulatory Visit (HOSPITAL_COMMUNITY): Payer: Self-pay | Admitting: *Deleted

## 2013-09-30 DIAGNOSIS — N644 Mastodynia: Secondary | ICD-10-CM

## 2013-09-30 DIAGNOSIS — N6320 Unspecified lump in the left breast, unspecified quadrant: Secondary | ICD-10-CM

## 2013-10-01 ENCOUNTER — Ambulatory Visit (HOSPITAL_COMMUNITY)
Admission: RE | Admit: 2013-10-01 | Discharge: 2013-10-01 | Disposition: A | Discharge status: Home / Self Care | Payer: No Typology Code available for payment source | Source: Ambulatory Visit | Attending: Obstetrics and Gynecology | Admitting: Obstetrics and Gynecology

## 2013-10-01 ENCOUNTER — Encounter (HOSPITAL_COMMUNITY): Payer: Self-pay

## 2013-10-01 VITALS — BP 102/64 | Temp 99.1°F | Ht 63.0 in | Wt 170.4 lb

## 2013-10-01 DIAGNOSIS — Z1239 Encounter for other screening for malignant neoplasm of breast: Secondary | ICD-10-CM

## 2013-10-01 DIAGNOSIS — N644 Mastodynia: Secondary | ICD-10-CM

## 2013-10-01 NOTE — Patient Instructions (Signed)
Taught Kiara Dillon how to perform BSE and gave educational materials to take home. Patient did not need a Pap smear today due to last Pap smear was May 2013 per patient. Let her know [facility] will cover Pap smears every 3 years unless has a history of abnormal Pap smears. Referred patient to the [facility] for diagnostic mammogram and possible right breast ultrasound. Appointment scheduled for Wednesday, October 02, 2013 at 1545. Patient aware of appointment and will be there.  Kiara Dillon verbalized understanding.  Brannock, Kathaleen Maser, RN 12:44 PM

## 2013-10-01 NOTE — Progress Notes (Signed)
Complaints of right breast pain that is a dull constant pain that is radiating down the arm. Patient rates pain at a 10 out of 10. Patient complained of the right breast being red and warm with ? Lump x 1 month.  Pap Smear:    Pap smear not completed today. Last Pap smear was May 2013 at the [facility] and normal per patient. Per patient has no history of an abnormal Pap smear. No Pap smear results in EPIC.  Physical exam: Breasts Breasts symmetrical. No skin abnormalities left breast. Red blotchy area covering right upper breast. Right breast not warm to touch.  No nipple retraction left breast. Right nipple slightly inverted per patient is a change. No nipple discharge bilateral breasts. No lymphadenopathy. No lumps palpated bilateral breasts. Complaints of right breast tenderness on exam. Referred patient to the [facility] for diagnostic mammogram and possible right breast ultrasound. Appointment scheduled for Wednesday, October 02, 2013 at 1545.  Pelvic/Bimanual No Pap smear completed today since last Pap smear was May 2013. Pap smear not indicated per [facility] guidelines.

## 2013-10-02 ENCOUNTER — Ambulatory Visit
Admission: RE | Admit: 2013-10-02 | Discharge: 2013-10-02 | Disposition: A | Discharge status: Home / Self Care | Payer: No Typology Code available for payment source | Source: Ambulatory Visit | Attending: Obstetrics and Gynecology | Admitting: Obstetrics and Gynecology

## 2013-10-02 ENCOUNTER — Other Ambulatory Visit (HOSPITAL_COMMUNITY): Payer: Self-pay | Admitting: Obstetrics and Gynecology

## 2013-10-02 DIAGNOSIS — N6310 Unspecified lump in the right breast, unspecified quadrant: Secondary | ICD-10-CM

## 2013-10-02 DIAGNOSIS — N644 Mastodynia: Secondary | ICD-10-CM

## 2013-10-02 DIAGNOSIS — N6320 Unspecified lump in the left breast, unspecified quadrant: Secondary | ICD-10-CM

## 2013-10-07 ENCOUNTER — Other Ambulatory Visit: Payer: Self-pay

## 2013-10-11 ENCOUNTER — Other Ambulatory Visit: Payer: Medicaid Other

## 2014-02-10 ENCOUNTER — Encounter (HOSPITAL_COMMUNITY): Payer: Self-pay

## 2014-02-10 ENCOUNTER — Inpatient Hospital Stay (HOSPITAL_COMMUNITY)
Admission: AD | Admit: 2014-02-10 | Discharge: 2014-02-11 | Disposition: A | Discharge status: Home / Self Care | Payer: No Typology Code available for payment source | Source: Ambulatory Visit | Attending: Obstetrics and Gynecology | Admitting: Obstetrics and Gynecology

## 2014-02-10 DIAGNOSIS — R1031 Right lower quadrant pain: Secondary | ICD-10-CM | POA: Insufficient documentation

## 2014-02-10 DIAGNOSIS — N390 Urinary tract infection, site not specified: Secondary | ICD-10-CM | POA: Insufficient documentation

## 2014-02-10 DIAGNOSIS — Z3202 Encounter for pregnancy test, result negative: Secondary | ICD-10-CM | POA: Insufficient documentation

## 2014-02-10 DIAGNOSIS — M549 Dorsalgia, unspecified: Secondary | ICD-10-CM | POA: Insufficient documentation

## 2014-02-10 DIAGNOSIS — G8929 Other chronic pain: Secondary | ICD-10-CM

## 2014-02-10 DIAGNOSIS — N760 Acute vaginitis: Secondary | ICD-10-CM | POA: Insufficient documentation

## 2014-02-10 DIAGNOSIS — B9689 Other specified bacterial agents as the cause of diseases classified elsewhere: Secondary | ICD-10-CM | POA: Insufficient documentation

## 2014-02-10 DIAGNOSIS — Z87891 Personal history of nicotine dependence: Secondary | ICD-10-CM | POA: Insufficient documentation

## 2014-02-10 DIAGNOSIS — R630 Anorexia: Secondary | ICD-10-CM | POA: Insufficient documentation

## 2014-02-10 DIAGNOSIS — N949 Unspecified condition associated with female genital organs and menstrual cycle: Secondary | ICD-10-CM | POA: Insufficient documentation

## 2014-02-10 DIAGNOSIS — A499 Bacterial infection, unspecified: Secondary | ICD-10-CM | POA: Insufficient documentation

## 2014-02-10 LAB — URINALYSIS, ROUTINE W REFLEX MICROSCOPIC
Bilirubin Urine: NEGATIVE
GLUCOSE, UA: NEGATIVE mg/dL
Hgb urine dipstick: NEGATIVE
Ketones, ur: NEGATIVE mg/dL
Nitrite: POSITIVE — AB
PROTEIN: NEGATIVE mg/dL
Urobilinogen, UA: 0.2 mg/dL (ref 0.0–1.0)
pH: 6 (ref 5.0–8.0)

## 2014-02-10 LAB — URINE MICROSCOPIC-ADD ON

## 2014-02-10 LAB — POCT PREGNANCY, URINE: PREG TEST UR: NEGATIVE

## 2014-02-10 NOTE — MAU Note (Signed)
Lower right abdominal pain that wraps around to low right back; intermittent,going on for 6 months.pain is worse in the morning. Diagnosed with "pelvic tenderness" by health department. Denies dysuria, vaginal bleeding or vaginal discharge.

## 2014-02-10 NOTE — MAU Note (Signed)
Was seen at urgent care today. Xray & urinalysis WNL. Was told she needed an ultrasound. Prescribed norco, flexeril and diclofenac; hasn't taken these meds yet b/c she took one of her mom's neurontin today and didn't want to mix the meds.

## 2014-02-11 ENCOUNTER — Inpatient Hospital Stay (HOSPITAL_COMMUNITY): Payer: No Typology Code available for payment source

## 2014-02-11 ENCOUNTER — Encounter (HOSPITAL_COMMUNITY): Payer: Self-pay

## 2014-02-11 DIAGNOSIS — N390 Urinary tract infection, site not specified: Secondary | ICD-10-CM | POA: Diagnosis not present

## 2014-02-11 LAB — GC/CHLAMYDIA PROBE AMP
CT Probe RNA: NEGATIVE
GC Probe RNA: NEGATIVE

## 2014-02-11 LAB — WET PREP, GENITAL
TRICH WET PREP: NONE SEEN
YEAST WET PREP: NONE SEEN

## 2014-02-11 LAB — CBC
HCT: 39.5 % (ref 36.0–46.0)
Hemoglobin: 13.9 g/dL (ref 12.0–15.0)
MCH: 30 pg (ref 26.0–34.0)
MCHC: 35.2 g/dL (ref 30.0–36.0)
MCV: 85.1 fL (ref 78.0–100.0)
Platelets: 199 10*3/uL (ref 150–400)
RBC: 4.64 MIL/uL (ref 3.87–5.11)
RDW: 13 % (ref 11.5–15.5)
WBC: 11.4 10*3/uL — ABNORMAL HIGH (ref 4.0–10.5)

## 2014-02-11 MED ORDER — LIDOCAINE 1%/NA BICARB 0.1 MEQ INJECTION
INJECTION | INTRAVENOUS | Status: AC
  Filled 2014-02-11: qty 1

## 2014-02-11 MED ORDER — IOHEXOL 300 MG/ML  SOLN
100.0000 mL | Freq: Once | INTRAMUSCULAR | Status: AC | PRN
  Administered 2014-02-11: 100 mL via INTRAVENOUS

## 2014-02-11 MED ORDER — METRONIDAZOLE 500 MG PO TABS: 500.0000 mg | ORAL_TABLET | Freq: Two times a day (BID) | ORAL | Status: DC

## 2014-02-11 MED ORDER — KETOROLAC TROMETHAMINE 60 MG/2ML IM SOLN: 60.0000 mg | INTRAMUSCULAR | Status: DC | PRN

## 2014-02-11 MED ORDER — CIPROFLOXACIN HCL 500 MG PO TABS: 500.0000 mg | ORAL_TABLET | Freq: Two times a day (BID) | ORAL | Status: DC

## 2014-02-11 MED ORDER — LACTATED RINGERS IV BOLUS (SEPSIS)
1000.0000 mL | Freq: Once | INTRAVENOUS | Status: AC
  Administered 2014-02-11: 1000 mL via INTRAVENOUS

## 2014-02-11 MED ORDER — IOHEXOL 300 MG/ML  SOLN
50.0000 mL | INTRAMUSCULAR | Status: AC
  Administered 2014-02-11 (×2): 50 mL via ORAL

## 2014-02-11 NOTE — Discharge Instructions (Signed)
Urinary Tract Infection Urinary tract infections (UTIs) can develop anywhere along your urinary tract. Your urinary tract is your body's drainage system for removing wastes and extra water. Your urinary tract includes two kidneys, two ureters, a bladder, and a urethra. Your kidneys are a pair of bean-shaped organs. Each kidney is about the size of your fist. They are located below your ribs, one on each side of your spine. CAUSES Infections are caused by microbes, which are microscopic organisms, including fungi, viruses, and bacteria. These organisms are so small that they can only be seen through a microscope. Bacteria are the microbes that most commonly cause UTIs. SYMPTOMS  Symptoms of UTIs may vary by age and gender of the patient and by the location of the infection. Symptoms in young women typically include a frequent and intense urge to urinate and a painful, burning feeling in the bladder or urethra during urination. Older women and men are more likely to be tired, shaky, and weak and have muscle aches and abdominal pain. A fever may mean the infection is in your kidneys. Other symptoms of a kidney infection include pain in your back or sides below the ribs, nausea, and vomiting. DIAGNOSIS To diagnose a UTI, your caregiver will ask you about your symptoms. Your caregiver also will ask to provide a urine sample. The urine sample will be tested for bacteria and white blood cells. White blood cells are made by your body to help fight infection. TREATMENT  Typically, UTIs can be treated with medication. Because most UTIs are caused by a bacterial infection, they usually can be treated with the use of antibiotics. The choice of antibiotic and length of treatment depend on your symptoms and the type of bacteria causing your infection. HOME CARE INSTRUCTIONS  If you were prescribed antibiotics, take them exactly as your caregiver instructs you. Finish the medication even if you feel better after you  have only taken some of the medication.  Drink enough water and fluids to keep your urine clear or pale yellow.  Avoid caffeine, tea, and carbonated beverages. They tend to irritate your bladder.  Empty your bladder often. Avoid holding urine for long periods of time.  Empty your bladder before and after sexual intercourse.  After a bowel movement, women should cleanse from front to back. Use each tissue only once. SEEK MEDICAL CARE IF:   You have back pain.  You develop a fever.  Your symptoms do not begin to resolve within 3 days. SEEK IMMEDIATE MEDICAL CARE IF:   You have severe back pain or lower abdominal pain.  You develop chills.  You have nausea or vomiting.  You have continued burning or discomfort with urination. MAKE SURE YOU:   Understand these instructions.  Will watch your condition.  Will get help right away if you are not doing well or get worse. Document Released: 09/14/2005 Document Revised: 06/05/2012 Document Reviewed: 01/13/2012 [facility] Patient Information 2014 [place].  Bacterial Vaginosis Bacterial vaginosis is a vaginal infection that occurs when the normal balance of bacteria in the vagina is disrupted. It results from an overgrowth of certain bacteria. This is the most common vaginal infection in women of childbearing age. Treatment is important to prevent complications, especially in pregnant women, as it can cause a premature delivery. CAUSES  Bacterial vaginosis is caused by an increase in harmful bacteria that are normally present in smaller amounts in the vagina. Several different kinds of bacteria can cause bacterial vaginosis. However, the reason that the condition  develops is not fully understood. RISK FACTORS Certain activities or behaviors can put you at an increased risk of developing bacterial vaginosis, including:  Having a new sex partner or multiple sex partners.  Douching.  Using an intrauterine device (IUD) for  contraception. Women do not get bacterial vaginosis from toilet seats, bedding, swimming pools, or contact with objects around them. SIGNS AND SYMPTOMS  Some women with bacterial vaginosis have no signs or symptoms. Common symptoms include:  Grey vaginal discharge.  A fishlike odor with discharge, especially after sexual intercourse.  Itching or burning of the vagina and vulva.  Burning or pain with urination. DIAGNOSIS  Your health care provider will take a medical history and examine the vagina for signs of bacterial vaginosis. A sample of vaginal fluid may be taken. Your health care provider will look at this sample under a microscope to check for bacteria and abnormal cells. A vaginal pH test may also be done.  TREATMENT  Bacterial vaginosis may be treated with antibiotic medicines. These may be given in the form of a pill or a vaginal cream. A second round of antibiotics may be prescribed if the condition comes back after treatment.  HOME CARE INSTRUCTIONS   Only take over-the-counter or prescription medicines as directed by your health care provider.  If antibiotic medicine was prescribed, take it as directed. Make sure you finish it even if you start to feel better.  Do not have sex until treatment is completed.  Tell all sexual partners that you have a vaginal infection. They should see their health care provider and be treated if they have problems, such as a mild rash or itching.  Practice safe sex by using condoms and only having one sex partner. SEEK MEDICAL CARE IF:   Your symptoms are not improving after 3 days of treatment.  You have increased discharge or pain.  You have a fever. MAKE SURE YOU:   Understand these instructions.  Will watch your condition.  Will get help right away if you are not doing well or get worse. FOR MORE INFORMATION  Centers for Disease Control and Prevention, Division of STD Prevention: [employer] American Sexual Health  Association (ASHA): www.ashastd.org  Document Released: 12/05/2005 Document Revised: 09/25/2013 Document Reviewed: 07/17/2013 [facility] Patient Information 2014 [place], [place].  Abdominal Pain, Women Abdominal (stomach, pelvic, or belly) pain can be caused by many things. It is important to tell your doctor:  The location of the pain.  Does it come and go or is it present all the time?  Are there things that start the pain (eating certain foods, exercise)?  Are there other symptoms associated with the pain (fever, nausea, vomiting, diarrhea)? All of this is helpful to know when trying to find the cause of the pain. CAUSES   Stomach: virus or bacteria infection, or ulcer.  Intestine: appendicitis (inflamed appendix), regional ileitis (Crohn's disease), ulcerative colitis (inflamed colon), irritable bowel syndrome, diverticulitis (inflamed diverticulum of the colon), or cancer of the stomach or intestine.  Gallbladder disease or stones in the gallbladder.  Kidney disease, kidney stones, or infection.  Pancreas infection or cancer.  Fibromyalgia (pain disorder).  Diseases of the female organs:  Uterus: fibroid (non-cancerous) tumors or infection.  Fallopian tubes: infection or tubal pregnancy.  Ovary: cysts or tumors.  Pelvic adhesions (scar tissue).  Endometriosis (uterus lining tissue growing in the pelvis and on the pelvic organs).  Pelvic congestion syndrome (female organs filling up with blood just before the menstrual period).  Pain with  the menstrual period.  Pain with ovulation (producing an egg).  Pain with an IUD (intrauterine device, birth control) in the uterus.  Cancer of the female organs.  Functional pain (pain not caused by a disease, may improve without treatment).  Psychological pain.  Depression. DIAGNOSIS  Your doctor will decide the seriousness of your pain by doing an examination.  Blood tests.  X-rays.  Ultrasound.  CT scan  (computed tomography, special type of X-ray).  MRI (magnetic resonance imaging).  Cultures, for infection.  Barium enema (dye inserted in the large intestine, to better view it with X-rays).  Colonoscopy (looking in intestine with a lighted tube).  Laparoscopy (minor surgery, looking in abdomen with a lighted tube).  Major abdominal exploratory surgery (looking in abdomen with a large incision). TREATMENT  The treatment will depend on the cause of the pain.   Many cases can be observed and treated at home.  Over-the-counter medicines recommended by your caregiver.  Prescription medicine.  Antibiotics, for infection.  Birth control pills, for painful periods or for ovulation pain.  Hormone treatment, for endometriosis.  Nerve blocking injections.  Physical therapy.  Antidepressants.  Counseling with a psychologist or psychiatrist.  Minor or major surgery. HOME CARE INSTRUCTIONS   Do not take laxatives, unless directed by your caregiver.  Take over-the-counter pain medicine only if ordered by your caregiver. Do not take aspirin because it can cause an upset stomach or bleeding.  Try a clear liquid diet (broth or water) as ordered by your caregiver. Slowly move to a bland diet, as tolerated, if the pain is related to the stomach or intestine.  Have a thermometer and take your temperature several times a day, and record it.  Bed rest and sleep, if it helps the pain.  Avoid sexual intercourse, if it causes pain.  Avoid stressful situations.  Keep your follow-up appointments and tests, as your caregiver orders.  If the pain does not go away with medicine or surgery, you may try:  Acupuncture.  Relaxation exercises (yoga, meditation).  Group therapy.  Counseling. SEEK MEDICAL CARE IF:   You notice certain foods cause stomach pain.  Your home care treatment is not helping your pain.  You need stronger pain medicine.  You want your IUD removed.  You  feel faint or lightheaded.  You develop nausea and vomiting.  You develop a rash.  You are having side effects or an allergy to your medicine. SEEK IMMEDIATE MEDICAL CARE IF:   Your pain does not go away or gets worse.  You have a fever.  Your pain is felt only in portions of the abdomen. The right side could possibly be appendicitis. The left lower portion of the abdomen could be colitis or diverticulitis.  You are passing blood in your stools (bright red or black tarry stools, with or without vomiting).  You have blood in your urine.  You develop chills, with or without a fever.  You pass out. MAKE SURE YOU:   Understand these instructions.  Will watch your condition.  Will get help right away if you are not doing well or get worse. Document Released: 10/02/2007 Document Revised: 02/27/2012 Document Reviewed: 10/22/2009 [facility] Patient Information 2014 [place].

## 2014-02-11 NOTE — MAU Provider Note (Signed)
Chief Complaint: Abdominal Pain, Pelvic Pain and Back Pain   First Provider Initiated Contact with Patient 02/11/14 0031     SUBJECTIVE HPI: Kiara Dillon is a 33 y.o. G25P1011 female who presents with sharp, right lower quadrant pain wrapping around her right low back x 6-12 months, worsening significantly over the past few days. 8/10 on pain scale now. Pain is constant with intermittent stabbing sensations. For the past week she has developed nausea in the mornings, Tmax 100.0 and decreased appetite. Has been evaluated at the health department and urgent care for the symptoms in the past. States UA, UPT and abdominal x-ray have all been normal. Was prescribed diclofenac, Vicodin and Flexeril at urgent care today and has just taken them upon arrival to maternity admissions. Records not available from these previous evaluations.   Past Medical History  Diagnosis Date  . Cholestasis of pregnancy   . 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 Wisdom tooth extraction    . No past surgeries     History   Social History  . Marital Status: Single    Spouse Name: N/A    Number of Children: N/A  . Years of Education: N/A   Occupational History  . Not on file.   Social History Main Topics  . Smoking status: Former Games developer  . Smokeless tobacco: Never Used  . Alcohol Use: No  . Drug Use: No  . Sexual Activity: Yes    Birth Control/ Protection: None   Other Topics Concern  . Not on file   Social History Narrative  . No narrative on file   No current facility-administered medications on file prior to encounter.   No current outpatient prescriptions on file prior  to encounter.   Allergies  Allergen Reactions  . Penicillins Other (See Comments)    Patient states that reaction to this medication, is a childhood reaction.    ROS: Pos RLQ pain wrapping around her right low back, bladder "fullness", decreased appetite, nausea, fever 100.0. Neg for vomiting, diarrhea, constipation, urgency, frequency, hematuria, flank pain, VB, vaginal discharge, dyspareunia.  OBJECTIVE Blood pressure 105/57, pulse 75, temperature 98.6 F (37 C), temperature source Oral, resp. rate 18, height 5\' 3"  (1.6 m), weight 76.749 kg (169 lb 3.2 oz), last menstrual period 01/22/2014, SpO2 98.00%. Patient Vitals for the past 24 hrs:  BP Temp Temp src Pulse Resp SpO2 Height Weight  02/11/14 0308 105/57 mmHg 98.6 F (37 C) Oral 75 18 - - -  02/11/14 0114 - - - 108 - 98 % - -  02/11/14 0059 - - - 106 - 98 % - -  02/11/14 0024 - - - 118 - 100 % - -  02/11/14 0014 - - - 112 - 100 % - -  02/10/14 2334 131/78 mmHg 98.8 F (37.1 C) Oral 137 18 100 % - -  02/10/14 2320 - - - - - - 5\' 3"  (1.6 m) 76.749 kg (169 lb 3.2 oz)  GENERAL: Well-developed, well-nourished female in moderate distress.  HEENT: Normocephalic HEART: Tachycardia. Regular rhythm. RESP: normal effort ABDOMEN: Soft, moderate tenderness at McBurney's point,? Rebound, no mass. Positive bowel sounds. BACK: Negative CVA tenderness. Low back nontender. EXTREMITIES: Nontender, no edema NEURO: Alert and oriented SPECULUM EXAM: NEFG, moderate amount of thin, white, malodorous discharge, no blood noted, cervix clean.  BIMANUAL: cervix closed; uterus normal size, mild right adnexal tenderness and fullness. no left adnexal tenderness or masses. No cervical motion tenderness.  LAB RESULTS Results for orders placed during the hospital encounter of 02/10/14 (from the past 24 hour(s))  URINALYSIS, ROUTINE W REFLEX MICROSCOPIC     Status: Abnormal   Collection Time    02/10/14 11:25 PM      Result Value Ref Range   Color,  Urine YELLOW  YELLOW   APPearance CLEAR  CLEAR   Specific Gravity, Urine >1.030 (*) 1.005 - 1.030   pH 6.0  5.0 - 8.0   Glucose, UA NEGATIVE  NEGATIVE mg/dL   Hgb urine dipstick NEGATIVE  NEGATIVE   Bilirubin Urine NEGATIVE  NEGATIVE   Ketones, ur NEGATIVE  NEGATIVE mg/dL   Protein, ur NEGATIVE  NEGATIVE mg/dL   Urobilinogen, UA 0.2  0.0 - 1.0 mg/dL   Nitrite POSITIVE (*) NEGATIVE   Leukocytes, UA TRACE (*) NEGATIVE  URINE MICROSCOPIC-ADD ON     Status: Abnormal   Collection Time    02/10/14 11:25 PM      Result Value Ref Range   Squamous Epithelial / LPF MANY (*) RARE   WBC, UA 3-6  <3 WBC/hpf   Bacteria, UA MANY (*) RARE   Urine-Other MUCOUS PRESENT    POCT PREGNANCY, URINE     Status: None   Collection Time    02/10/14 11:28 PM      Result Value Ref Range   Preg Test, Ur NEGATIVE  NEGATIVE  WET PREP, GENITAL     Status: Abnormal   Collection Time    02/11/14 12:45 AM      Result Value Ref Range   Yeast Wet Prep HPF POC NONE SEEN  NONE SEEN   Trich, Wet Prep NONE SEEN  NONE SEEN   Clue Cells Wet Prep HPF POC FEW (*) NONE SEEN   WBC, Wet Prep HPF POC FEW (*) NONE SEEN  CBC     Status: Abnormal   Collection Time    02/11/14 12:54 AM      Result Value Ref Range   WBC 11.4 (*) 4.0 - 10.5 K/uL   RBC 4.64  3.87 - 5.11 MIL/uL   Hemoglobin 13.9  12.0 - 15.0 g/dL   HCT 40.939.5  81.136.0 - 91.446.0 %   MCV 85.1  78.0 - 100.0 fL   MCH 30.0  26.0 - 34.0 pg   MCHC 35.2  30.0 - 36.0 g/dL   RDW 78.213.0  95.611.5 - 21.315.5 %   Platelets 199  150 - 400 K/uL    IMAGING [place] Transvaginal Non-ob  02/11/2014   CLINICAL DATA:  33 year old female with right pelvic pain. Negative pregnancy test.  EXAM: TRANSABDOMINAL AND TRANSVAGINAL ULTRASOUND OF PELVIS  TECHNIQUE: Both transabdominal and transvaginal ultrasound examinations of the pelvis were performed. Transabdominal technique was performed for global imaging of the pelvis including uterus, ovaries, adnexal regions, and pelvic cul-de-sac. It was necessary to  proceed with endovaginal exam following the transabdominal exam to visualize the ovaries and endometrium.  COMPARISON:  08/09/2012 ultrasound  FINDINGS: Uterus  Measurements: Anteverted measuring 8 x  3.5 x 4 cm. No fibroids or other mass visualized.  Endometrium  Thickness: 6.2 mm.  No focal abnormality visualized.  Right ovary  Measurements: 2.9 x 1.7 x 2.4 cm. Normal appearance/no adnexal mass.  Left ovary  Measurements: 4.3 x 2.7 x 2.4 cm. Normal appearance/no adnexal mass. A 2 cm simple cyst/ follicle is noted.  Other findings  A trace amount of free fluid is noted. There is no evidence of solid adnexal mass.  IMPRESSION: Trace amount of free pelvic fluid which may be physiologic.  Otherwise unremarkable exam.   Electronically Signed   By: Laveda Abbe M.D.   On: 02/11/2014 01:41   US Pelvis Complete  02/11/2014   CLINICAL DATA:  33 year old female with right pelvic pain. Negative pregnancy test.  EXAM: TRANSABDOMINAL AND TRANSVAGINAL ULTRASOUND OF PELVIS  TECHNIQUE: Both transabdominal and transvaginal ultrasound examinations of the pelvis were performed. Transabdominal technique was performed for global imaging of the pelvis including uterus, ovaries, adnexal regions, and pelvic cul-de-sac. It was necessary to proceed with endovaginal exam following the transabdominal exam to visualize the ovaries and endometrium.  COMPARISON:  08/09/2012 ultrasound  FINDINGS: Uterus  Measurements: Anteverted measuring 8 x 3.5 x 4 cm. No fibroids or other mass visualized.  Endometrium  Thickness: 6.2 mm.  No focal abnormality visualized.  Right ovary  Measurements: 2.9 x 1.7 x 2.4 cm. Normal appearance/no adnexal mass.  Left ovary  Measurements: 4.3 x 2.7 x 2.4 cm. Normal appearance/no adnexal mass. A 2 cm simple cyst/ follicle is noted.  Other findings  A trace amount of free fluid is noted. There is no evidence of solid adnexal mass.  IMPRESSION: Trace amount of free pelvic fluid which may be physiologic.  Otherwise  unremarkable exam.   Electronically Signed   By: Laveda Abbe M.D.   On: 02/11/2014 01:41   Ct Abdomen Pelvis W Contrast  02/11/2014   CLINICAL DATA:  Right lower quadrant abdominal pain and tenderness. Leukocytosis and white blood cells in the urine.  EXAM: CT ABDOMEN AND PELVIS WITH CONTRAST  TECHNIQUE: Multidetector CT imaging of the abdomen and pelvis was performed using the standard protocol following bolus administration of intravenous contrast.  CONTRAST:  OMNIPAQUE IOHEXOL 300 MG/ML  SOLN  COMPARISON:  US PELVIS COMPLETE dated 02/11/2014; CT ABDOMEN W/CM dated 04/08/2004  FINDINGS: The visualized lung bases are clear.  The liver and spleen are unremarkable in appearance. The gallbladder is within normal limits. The pancreas and adrenal glands are unremarkable.  The kidneys are unremarkable in appearance. There is no evidence of hydronephrosis. No renal or ureteral stones are seen. No perinephric stranding is appreciated.  No free fluid is identified. The small bowel is unremarkable in appearance. The stomach is within normal limits. No acute vascular abnormalities are seen.  The appendix is normal in caliber and contains trace residual contrast, without evidence for appendicitis. The colon is unremarkable in appearance.  The bladder is decompressed; apparent bladder wall thickening may simply reflect decompression, or possibly cystitis. The uterus is grossly unremarkable in appearance. The ovaries are relatively symmetric, aside from a few left-sided follicles. No suspicious adnexal masses are seen. No inguinal lymphadenopathy is seen.  No acute osseous abnormalities are identified.  IMPRESSION: 1. Bladder wall thickening may simply reflect decompression, or possibly cystitis, given the patient's white blood cells in the urine. 2. Otherwise unremarkable CT of the abdomen and pelvis.   Electronically Signed   By: Roanna Raider M.D.   On: 02/11/2014 05:45  MAU COURSE CBC, UPT, UA, wet prep, GC/CT,  pelvic ultrasound.  Discussed exam results with Dr. Jolayne Panther. Patient has UTI and BV, it seems unlikely that these 2 are responsible for the intensity, duration and nature of the pain the patient describes. Abdomen pelvis CT with contrast ordered. IV bolus given. Tachycardia resolved.  ASSESSMENT 1. UTI (lower urinary tract infection)   2. BV (bacterial vaginosis)   3. Chronic RLQ pain    PLAN Discharge home in stable condition. Follow-up Information   Follow up with Primary care provider. (As needed if symptoms do not improve)       Follow up with [facility]. (As needed in emergencies)    Contact information:   [address] [place] [address]           Follow-up Information   Follow up with Primary care provider. (As needed if symptoms do not improve)       Follow up with [facility]. (As needed in emergencies)    Contact information:   [address] [place] [address]        Medication List    STOP taking these medications       gabapentin 100 MG capsule  Commonly known as:  NEURONTIN      TAKE these medications       ciprofloxacin 500 MG tablet  Commonly known as:  CIPRO  Take 1 tablet (500 mg total) by mouth 2 (two) times daily.     cyclobenzaprine 10 MG tablet  Commonly known as:  FLEXERIL  Take 10 mg by mouth 3 (three) times daily as needed for muscle spasms.     diclofenac 75 MG EC tablet  Commonly known as:  VOLTAREN  Take 75 mg by mouth 2 (two) times daily.     HYDROcodone-acetaminophen 5-325 MG per tablet  Commonly known as:  NORCO/VICODIN  Take 1 tablet by mouth every 6 (six) hours as needed for moderate pain.     metroNIDAZOLE 500 MG tablet  Commonly known as:  FLAGYL  Take 1 tablet (500 mg total) by mouth 2 (two) times daily.       [place], [place] 02/11/2014  5:53 AM

## 2014-02-12 NOTE — MAU Provider Note (Signed)
Attestation of Attending Supervision of Advanced Practitioner (CNM/NP): Evaluation and management procedures were performed by the Advanced Practitioner under my supervision and collaboration.  I have reviewed the Advanced Practitioner's note and chart, and I agree with the management and plan.  Nicolaos Mitrano 02/12/2014 8:41 AM

## 2014-02-15 ENCOUNTER — Emergency Department (HOSPITAL_COMMUNITY)
Admission: EM | Admit: 2014-02-15 | Discharge: 2014-02-16 | Disposition: A | Discharge status: Home / Self Care | Payer: No Typology Code available for payment source | Attending: Emergency Medicine | Admitting: Emergency Medicine

## 2014-02-15 ENCOUNTER — Encounter (HOSPITAL_COMMUNITY): Payer: Self-pay | Admitting: Emergency Medicine

## 2014-02-15 DIAGNOSIS — Z3202 Encounter for pregnancy test, result negative: Secondary | ICD-10-CM | POA: Insufficient documentation

## 2014-02-15 DIAGNOSIS — Z8619 Personal history of other infectious and parasitic diseases: Secondary | ICD-10-CM | POA: Insufficient documentation

## 2014-02-15 DIAGNOSIS — Z791 Long term (current) use of non-steroidal anti-inflammatories (NSAID): Secondary | ICD-10-CM | POA: Insufficient documentation

## 2014-02-15 DIAGNOSIS — Z87891 Personal history of nicotine dependence: Secondary | ICD-10-CM | POA: Insufficient documentation

## 2014-02-15 DIAGNOSIS — R143 Flatulence: Secondary | ICD-10-CM

## 2014-02-15 DIAGNOSIS — M545 Low back pain, unspecified: Secondary | ICD-10-CM | POA: Insufficient documentation

## 2014-02-15 DIAGNOSIS — R141 Gas pain: Secondary | ICD-10-CM | POA: Insufficient documentation

## 2014-02-15 DIAGNOSIS — K859 Acute pancreatitis without necrosis or infection, unspecified: Secondary | ICD-10-CM | POA: Insufficient documentation

## 2014-02-15 DIAGNOSIS — R142 Eructation: Secondary | ICD-10-CM | POA: Insufficient documentation

## 2014-02-15 DIAGNOSIS — Z79899 Other long term (current) drug therapy: Secondary | ICD-10-CM | POA: Insufficient documentation

## 2014-02-15 DIAGNOSIS — Z792 Long term (current) use of antibiotics: Secondary | ICD-10-CM | POA: Insufficient documentation

## 2014-02-15 DIAGNOSIS — Z880 Allergy status to penicillin: Secondary | ICD-10-CM | POA: Insufficient documentation

## 2014-02-15 DIAGNOSIS — G8929 Other chronic pain: Secondary | ICD-10-CM | POA: Insufficient documentation

## 2014-02-15 MED ORDER — MORPHINE SULFATE 4 MG/ML IJ SOLN
4.0000 mg | Freq: Once | INTRAMUSCULAR | Status: AC
Start: 2014-02-16 — End: 2014-02-16
  Administered 2014-02-16: 4 mg via INTRAVENOUS
  Filled 2014-02-15: qty 1

## 2014-02-15 NOTE — ED Notes (Signed)
Pt reports generalized abdominal pain that began three days ago. Pt reports nausea, lower back pain, and abdominal distention. Pt denies emesis. Pt is A/O x4, in NAD, and vitals are WDL. Pt states [facility].

## 2014-02-15 NOTE — ED Provider Notes (Signed)
CSN: [id number]     Arrival date & time 02/15/14  2252 History   First MD Initiated Contact with Patient 02/15/14 2315     Chief Complaint  Patient presents with  . Abdominal Pain    HPI  Kiara Dillon is a 33 y.o. female with a PMH of cholestasis of pregnancy and HSV infection who presents to the ED for evaluation of abdominal pain.  History was provided by the patient. Patient states that she has had chronic abdominal pain for "years" which worsened over the past three days. Her pain is similar to her chronic pain, however, she now feels bloated, which is different than her usual presentation. Her pain is located diffusely throughout and is described as a bloating sensation with intermittent sharp pains occuring at various locations throughout her abdomen. She also reports nausea with no emesis. She denies any diarrhea or constipation. She has had flatulence and belching. No dysuria, urinary frequency, or vaginal discharge/bleeding. No fevers, chills. Patient has chronic lower back pain with no acute changes. Patient seen at an UC on 02/11/14 for similar unchanged abdominal pain and prescribed Vicodin, flexeril, and diclofenac. Patient later that day was sent to women's health and had a negative CT and pelvic US. UA positive for UTI. Patient started on cipro which she has been taking with no missed doses. Patient also had a unremarkable pelvic with STD testing. Found to have BV and started on Flagyl. No new sexual partners. Does not drink alcohol.     Past Medical History  Diagnosis Date  . Cholestasis of pregnancy   . 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 Wisdom tooth extraction    . No past surgeries     Family History  Problem Relation Age of Onset  . Arthritis Mother   . Asthma Mother   . Depression Mother   . Hypertension Mother   . Miscarriages / Stillbirths Maternal Aunt   . Stroke Paternal Uncle   . Heart  disease Paternal Uncle   . Cancer Maternal Grandfather     kidney  . Heart disease Paternal Grandmother    History  Substance Use Topics  . Smoking status: Former Games developer  . Smokeless tobacco: Never Used  . Alcohol Use: No   OB History   Grav Para Term Preterm Abortions TAB SAB Ect Mult Living   2 1 1  0 1 1 0 0 0 1     Review of Systems  Constitutional: Positive for appetite change. Negative for fever, chills, diaphoresis, activity change and fatigue.  HENT: Negative for congestion, rhinorrhea and sore throat.   Respiratory: Negative for cough and shortness of breath.   Cardiovascular: Negative for chest pain and leg swelling.  Gastrointestinal: Positive for nausea, abdominal pain and abdominal distention. Negative for vomiting, diarrhea, constipation and blood in stool.  Genitourinary: Negative for dysuria, hematuria, decreased urine volume, vaginal bleeding, vaginal discharge, difficulty urinating, vaginal pain and menstrual problem.  Musculoskeletal: Positive for back pain (chronic). Negative for myalgias.  Neurological: Negative for dizziness, weakness, light-headedness and headaches.    Allergies  Penicillins  Home Medications   Current Outpatient Rx  Name  Route  Sig  Dispense  Refill  . ciprofloxacin (CIPRO) 500 MG tablet   Oral   Take 1 tablet (500 mg total) by mouth 2 (two) times daily.   14 tablet   0   . cyclobenzaprine (FLEXERIL) 10  MG tablet   Oral   Take 10 mg by mouth 3 (three) times daily as needed for muscle spasms.         . diclofenac (VOLTAREN) 75 MG EC tablet   Oral   Take 75 mg by mouth 2 (two) times daily.         Marland Kitchen HYDROcodone-acetaminophen (NORCO/VICODIN) 5-325 MG per tablet   Oral   Take 1 tablet by mouth every 6 (six) hours as needed for moderate pain.         . metroNIDAZOLE (FLAGYL) 500 MG tablet   Oral   Take 1 tablet (500 mg total) by mouth 2 (two) times daily.   14 tablet   0    BP 108/70  Pulse 92  Temp(Src) 98.9 F  (37.2 C) (Oral)  Resp 18  SpO2 100%  LMP 01/22/2014  Filed Vitals:   02/15/14 2307 02/16/14 0110  BP: 108/70 112/59  Pulse: 92 72  Temp: 98.9 F (37.2 C)   TempSrc: Oral   Resp: 18 16  SpO2: 100% 98%    Physical Exam  Nursing note and vitals reviewed. Constitutional: She is oriented to person, place, and time. She appears well-developed and well-nourished. No distress.  HENT:  Head: Normocephalic and atraumatic.  Right Ear: External ear normal.  Left Ear: External ear normal.  Nose: Nose normal.  Mouth/Throat: Oropharynx is clear and moist. No oropharyngeal exudate.  Eyes: Conjunctivae are normal. Right eye exhibits no discharge. Left eye exhibits no discharge.  Neck: Normal range of motion. Neck supple.  Cardiovascular: Normal rate, regular rhythm and normal heart sounds.  Exam reveals no gallop and no friction rub.   No murmur heard. Pulmonary/Chest: Effort normal and breath sounds normal. No respiratory distress. She has no wheezes. She has no rales. She exhibits no tenderness.  Abdominal: Soft. Bowel sounds are normal. She exhibits no distension and no mass. There is tenderness. There is no rebound and no guarding.  Diffuse tenderness to the abdomen throughout with no focal tenderness.   Musculoskeletal: Normal range of motion. She exhibits no edema and no tenderness.  No lumbar, CVA, or flank tenderness bilaterally  Neurological: She is alert and oriented to person, place, and time.  Skin: Skin is warm and dry. She is not diaphoretic.     ED Course  Procedures (including critical care time) Labs Review Labs Reviewed - No data to display Imaging Review No results found.   EKG Interpretation None      Results for orders placed during the hospital encounter of 02/15/14  CBC WITH DIFFERENTIAL      Result Value Ref Range   WBC 10.2  4.0 - 10.5 K/uL   RBC 4.85  3.87 - 5.11 MIL/uL   Hemoglobin 14.7  12.0 - 15.0 g/dL   HCT 16.1  09.6 - 04.5 %   MCV 86.4  78.0 -  100.0 fL   MCH 30.3  26.0 - 34.0 pg   MCHC 35.1  30.0 - 36.0 g/dL   RDW 40.9  81.1 - 91.4 %   Platelets 231  150 - 400 K/uL   Neutrophils Relative % 54  43 - 77 %   Neutro Abs 5.5  1.7 - 7.7 K/uL   Lymphocytes Relative 34  12 - 46 %   Lymphs Abs 3.4  0.7 - 4.0 K/uL   Monocytes Relative 6  3 - 12 %   Monocytes Absolute 0.6  0.1 - 1.0 K/uL   Eosinophils Relative 6 (*)  0 - 5 %   Eosinophils Absolute 0.6  0.0 - 0.7 K/uL   Basophils Relative 0  0 - 1 %   Basophils Absolute 0.0  0.0 - 0.1 K/uL  COMPREHENSIVE METABOLIC PANEL      Result Value Ref Range   Sodium 138  137 - 147 mEq/L   Potassium 3.8  3.7 - 5.3 mEq/L   Chloride 101  96 - 112 mEq/L   CO2 24  19 - 32 mEq/L   Glucose, Bld 91  70 - 99 mg/dL   BUN 15  6 - 23 mg/dL   Creatinine, Ser 1.61  0.50 - 1.10 mg/dL   Calcium 9.4  8.4 - 09.6 mg/dL   Total Protein 7.5  6.0 - 8.3 g/dL   Albumin 4.3  3.5 - 5.2 g/dL   AST 15  0 - 37 U/L   ALT 17  0 - 35 U/L   Alkaline Phosphatase 76  39 - 117 U/L   Total Bilirubin <0.2 (*) 0.3 - 1.2 mg/dL   GFR calc non Af Amer 88 (*) >90 mL/min   GFR calc Af Amer >90  >90 mL/min  LIPASE, BLOOD      Result Value Ref Range   Lipase 143 (*) 11 - 59 U/L  URINALYSIS, ROUTINE W REFLEX MICROSCOPIC      Result Value Ref Range   Color, Urine YELLOW  YELLOW   APPearance CLEAR  CLEAR   Specific Gravity, Urine 1.018  1.005 - 1.030   pH 6.0  5.0 - 8.0   Glucose, UA NEGATIVE  NEGATIVE mg/dL   Hgb urine dipstick NEGATIVE  NEGATIVE   Bilirubin Urine NEGATIVE  NEGATIVE   Ketones, ur NEGATIVE  NEGATIVE mg/dL   Protein, ur NEGATIVE  NEGATIVE mg/dL   Urobilinogen, UA 0.2  0.0 - 1.0 mg/dL   Nitrite NEGATIVE  NEGATIVE   Leukocytes, UA NEGATIVE  NEGATIVE  PREGNANCY, URINE      Result Value Ref Range   Preg Test, Ur NEGATIVE  NEGATIVE    DG Abd Acute W/Chest (Final result)  Result time: 02/16/14 00:45:34    Final result by Rad Results In Interface (02/16/14 00:45:34)    Narrative:   CLINICAL DATA: Abdominal  pain, nausea, diarrhea  EXAM: ACUTE ABDOMEN SERIES (ABDOMEN 2 VIEW & CHEST 1 VIEW)  COMPARISON: CT abdomen pelvis dated 02/11/2014  FINDINGS: Lungs are clear. No pleural effusion or pneumothorax.  The heart is normal in size.  Nonobstructive bowel gas pattern.  No evidence of free air under the diaphragm on the upright view.  Visualized osseous structures are within normal limits.  IMPRESSION: No evidence of acute cardiopulmonary disease.  No evidence of small bowel obstruction or free air.   Electronically Signed By: Charline Bills M.D. On: 02/16/2014 00:45          MDM   TANEEKA CURTNER is a 33 y.o. female with a PMH of cholestasis of pregnancy and HSV infection who presents to the ED for evaluation of abdominal pain.   Rechecks  1:44 AM = Pain returning. Ordering another 4 mg morphine. Patient appears comfortable.     2:00 AM = Sign-out care to Ivonne Andrew PA-C to await results of abdominal US. Patient had elevated lipase (143). Pancreatitis may be cause of abdominal pain and bloating. Labs otherwise unremarkable. Acute abdominal series unremarkable. Abdominal exam benign. Patient evaluated this week for similar abdominal pain with an unremarkable CT, pelvic US and pelvic exam testing (results reviewed). Currently on  Cipro for UTI and Flagyl for bacterial vaginosis. Likely discharge home after being re-evaluated.     Luiz IronJessica Katlin Yang Rack PA-C   This patient was discussed with Dr. Ines BloomerYelverton        Lener Ventresca K Athalia Setterlund, PA-C 02/17/14 1032

## 2014-02-16 ENCOUNTER — Emergency Department (HOSPITAL_COMMUNITY): Payer: No Typology Code available for payment source

## 2014-02-16 HISTORY — PX: ESOPHAGOGASTRODUODENOSCOPY: SHX1529

## 2014-02-16 HISTORY — PX: COLONOSCOPY: SHX174

## 2014-02-16 LAB — URINALYSIS, ROUTINE W REFLEX MICROSCOPIC
Bilirubin Urine: NEGATIVE
Glucose, UA: NEGATIVE mg/dL
Hgb urine dipstick: NEGATIVE
Ketones, ur: NEGATIVE mg/dL
Leukocytes, UA: NEGATIVE
NITRITE: NEGATIVE
PROTEIN: NEGATIVE mg/dL
Specific Gravity, Urine: 1.018 (ref 1.005–1.030)
UROBILINOGEN UA: 0.2 mg/dL (ref 0.0–1.0)
pH: 6 (ref 5.0–8.0)

## 2014-02-16 LAB — CBC WITH DIFFERENTIAL/PLATELET
Basophils Absolute: 0 10*3/uL (ref 0.0–0.1)
Basophils Relative: 0 % (ref 0–1)
EOS ABS: 0.6 10*3/uL (ref 0.0–0.7)
Eosinophils Relative: 6 % — ABNORMAL HIGH (ref 0–5)
HCT: 41.9 % (ref 36.0–46.0)
HEMOGLOBIN: 14.7 g/dL (ref 12.0–15.0)
Lymphocytes Relative: 34 % (ref 12–46)
Lymphs Abs: 3.4 10*3/uL (ref 0.7–4.0)
MCH: 30.3 pg (ref 26.0–34.0)
MCHC: 35.1 g/dL (ref 30.0–36.0)
MCV: 86.4 fL (ref 78.0–100.0)
MONO ABS: 0.6 10*3/uL (ref 0.1–1.0)
MONOS PCT: 6 % (ref 3–12)
Neutro Abs: 5.5 10*3/uL (ref 1.7–7.7)
Neutrophils Relative %: 54 % (ref 43–77)
PLATELETS: 231 10*3/uL (ref 150–400)
RBC: 4.85 MIL/uL (ref 3.87–5.11)
RDW: 12.9 % (ref 11.5–15.5)
WBC: 10.2 10*3/uL (ref 4.0–10.5)

## 2014-02-16 LAB — COMPREHENSIVE METABOLIC PANEL
ALT: 17 U/L (ref 0–35)
AST: 15 U/L (ref 0–37)
Albumin: 4.3 g/dL (ref 3.5–5.2)
Alkaline Phosphatase: 76 U/L (ref 39–117)
BUN: 15 mg/dL (ref 6–23)
CALCIUM: 9.4 mg/dL (ref 8.4–10.5)
CO2: 24 mEq/L (ref 19–32)
CREATININE: 0.87 mg/dL (ref 0.50–1.10)
Chloride: 101 mEq/L (ref 96–112)
GFR calc non Af Amer: 88 mL/min — ABNORMAL LOW (ref >90)
GLUCOSE: 91 mg/dL (ref 70–99)
Potassium: 3.8 mEq/L (ref 3.7–5.3)
Sodium: 138 mEq/L (ref 137–147)
TOTAL PROTEIN: 7.5 g/dL (ref 6.0–8.3)
Total Bilirubin: <0.2 mg/dL — ABNORMAL LOW (ref 0.3–1.2)

## 2014-02-16 LAB — LIPASE, BLOOD: LIPASE: 143 U/L — AB (ref 11–59)

## 2014-02-16 LAB — PREGNANCY, URINE: PREG TEST UR: NEGATIVE

## 2014-02-16 MED ORDER — SODIUM CHLORIDE 0.9 % IV BOLUS (SEPSIS)
1000.0000 mL | Freq: Once | INTRAVENOUS | Status: AC
  Administered 2014-02-16: 1000 mL via INTRAVENOUS

## 2014-02-16 MED ORDER — MORPHINE SULFATE 4 MG/ML IJ SOLN
4.0000 mg | Freq: Once | INTRAMUSCULAR | Status: AC
  Administered 2014-02-16: 4 mg via INTRAVENOUS
  Filled 2014-02-16: qty 1

## 2014-02-16 MED ORDER — PROMETHAZINE HCL 25 MG PO TABS: 25.0000 mg | ORAL_TABLET | Freq: Four times a day (QID) | ORAL | Status: DC | PRN

## 2014-02-16 MED ORDER — HYDROCODONE-ACETAMINOPHEN 5-325 MG PO TABS: 1.0000 | ORAL_TABLET | ORAL | Status: DC | PRN

## 2014-02-16 NOTE — Discharge Instructions (Signed)
Your testing and ultrasound have not shown any signs of an emergent cause for your abdominal pain. Your lipase enzyme level was elevated suggesting possible mild pancreatitis. It is recommended that you have a clear liquid diet for the next 3 days and increase her diet slowly. Followup with a gastroenterology specialist or primary care provider for continued evaluation and treatment. Return at any time to the emergency department for changing or worsening symptoms.     Acute Pancreatitis Acute pancreatitis is a disease in which the pancreas becomes suddenly irritated (inflamed). The pancreas is a large gland behind your stomach. The pancreas makes enzymes that help digest food. The pancreas also makes 2 hormones that help control your blood sugar. Acute pancreatitis happens when the enzymes attack and damage the pancreas. Most attacks last a couple of days and can cause serious problems. HOME CARE  Follow your doctor's diet instructions. You may need to avoid alcohol and limit fat in your diet.  Eat small meals often.  Drink enough fluids to keep your pee (urine) clear or pale yellow.  Only take medicines as told by your doctor.  Avoid drinking alcohol if it caused your disease.  Do not smoke.  Get plenty of rest.  Check your blood sugar at home as told by your doctor.  Keep all doctor visits as told. GET HELP RIGHT AWAY IF:   You are unable to eat or keep fluids down.  Your pain becomes severe.  You have a fever or lasting symptoms for more than 2 to 3 days.  You have a fever and your symptoms suddenly get worse.  Your skin or the white part of your eyes turn yellow (jaundice).  You throw up (vomit).  You feel dizzy, or you pass out (faint).  Your blood sugar is high (over 300 mg/dL).  You do not get better as quickly as expected.  You have new or worsening symptoms.  You have lasting pain, weakness, or feel sick to your stomach (nauseous).  You get better and then  have another pain attack. MAKE SURE YOU:   Understand these instructions.  Will watch your condition.  Will get help right away if you are not doing well or get worse. Document Released: 05/23/2008 Document Revised: 06/05/2012 Document Reviewed: 03/15/2012 [facility] Patient Information 2014 [place], [place].    Diet The clear liquid diet consists of foods that are liquid or will become liquid at room temperature. Examples of foods allowed on a clear liquid diet include fruit juice, broth or bouillon, gelatin, or frozen ice pops. You should be able to see through the liquid. The purpose of this diet is to provide the necessary fluids, electrolytes (such as sodium and potassium), and energy to keep the body functioning during times when you are not able to consume a regular diet. A clear liquid diet should not be continued for long periods of time, as it is not nutritionally adequate.  A CLEAR LIQUID DIET MAY BE NEEDED:  When a sudden-onset (acute) condition occurs before or after surgery.   As the first step in oral feeding.   For fluid and electrolyte replacement in diarrheal diseases.   As a diet before certain medical tests are performed.  ADEQUACY The clear liquid diet is adequate only in ascorbic acid, according to the Recommended Dietary Allowances of the [employer].  CHOOSING FOODS Breads and Starches  Allowed: None are allowed.   Avoid: All are to be avoided.  Vegetables  Allowed: Strained vegetable juices.  Avoid: Any others.  Fruit  Allowed: Strained fruit juices and fruit drinks. Include 1 serving of citrus or vitamin C-enriched fruit juice daily.   Avoid: Any others.  Meat and Meat Substitutes  Allowed: None are allowed.   Avoid: All are to be avoided.  Milk Products  Allowed: None are allowed.   Avoid: All are to be avoided.  Soups and Combination Foods  Allowed: Clear bouillon, broth, or strained  broth-based soups.   Avoid: Any others.  Desserts and Sweets  Allowed: Sugar, honey. High-protein gelatin. Flavored gelatin, ices, or frozen ice pops that do not contain milk.   Avoid: Any others.  Fats and Oils  Allowed: None are allowed.   Avoid: All are to be avoided.  Beverages  Allowed: Cereal beverages, coffee (regular or decaffeinated), tea, or soda at the discretion of your health care provider.   Avoid: Any others.  Condiments  Allowed: Salt.   Avoid: Any others, including pepper.  Supplements  Allowed: Liquid nutrition beverages that you can see through.   Avoid: Any others that contain lactose or fiber. SAMPLE MEAL PLAN Breakfast  4 oz (120 mL) strained orange juice.   to 1 cup (120 to 240 mL) gelatin (plain or fortified).  1 cup (240 mL) beverage (coffee or tea).  Sugar, if desired. Midmorning Snack   cup (120 mL) gelatin (plain or fortified). Lunch  1 cup (240 mL) broth or consomm.  4 oz (120 mL) strained grapefruit juice.   cup (120 mL) gelatin (plain or fortified).  1 cup (240 mL) beverage (coffee or tea).  Sugar, if desired. Midafternoon Snack   cup (120 mL) fruit ice.   cup (120 mL) strained fruit juice. Dinner  1 cup (240 mL) broth or consomm.   cup (120 mL) cranberry juice.   cup (120 mL) flavored gelatin (plain or fortified).  1 cup (240 mL) beverage (coffee or tea).  Sugar, if desired. Evening Snack  4 oz (120 mL) strained apple juice (vitamin C-fortified).   cup (120 mL) flavored gelatin (plain or fortified). MAKE SURE YOU:  Understand these instructions.  Will watch your child's condition.  Will get help right away if your child is not doing well or gets worse. Document Released: 12/05/2005 Document Revised: 08/07/2013 Document Reviewed: 05/07/2013 [facility] Patient Information 2014 [place], [place].

## 2014-02-16 NOTE — ED Notes (Signed)
US at bedside

## 2014-02-16 NOTE — ED Provider Notes (Signed)
Medical screening examination/treatment/procedure(s) were performed by non-physician practitioner and as supervising physician I was immediately available for consultation/collaboration.   EKG Interpretation None       Boneta Standre, MD 02/16/14 0544 

## 2014-02-16 NOTE — ED Provider Notes (Signed)
Kiara Dillon S 2:00 AM patient discussed and signed. Patient with histories of chronic abdominal pain symptoms presenting with similar ongoing abdominal pain. Pain does radiate to the back. Lab testing shows slightly elevated lipase level. Ultrasound pending. Patient has had recent abdominal pelvis CT.  Ultrasound does not show any concerning findings or the portions of the visualized pancreas. No signs of gallstones or obstructing biliary stone. Patient is tolerating by mouth fluids. No vomiting. She does have some continued discomforts and states morphine medicines are wearing off slightly but otherwise appears well. Discussed the findings and treatment plan with the patient. She has been instructed to have clear liquid diet for the next 3 days and followup with GI specialist or PCP. She agrees with plan. Strict return precautions given.    Angus SellerPeter S Maycen Degregory, PA-C 02/16/14 [id number]

## 2014-02-17 LAB — URINE CULTURE
Colony Count: NO GROWTH
Culture: NO GROWTH

## 2014-02-18 ENCOUNTER — Emergency Department (HOSPITAL_COMMUNITY)
Admission: EM | Admit: 2014-02-18 | Discharge: 2014-02-18 | Disposition: A | Discharge status: Home / Self Care | Payer: No Typology Code available for payment source | Source: Home / Self Care | Attending: Emergency Medicine | Admitting: Emergency Medicine

## 2014-02-18 ENCOUNTER — Encounter (HOSPITAL_COMMUNITY): Payer: Self-pay | Admitting: Emergency Medicine

## 2014-02-18 ENCOUNTER — Emergency Department (HOSPITAL_COMMUNITY)
Admission: EM | Admit: 2014-02-18 | Discharge: 2014-02-18 | Disposition: A | Discharge status: Home / Self Care | Payer: No Typology Code available for payment source | Attending: Emergency Medicine | Admitting: Emergency Medicine

## 2014-02-18 DIAGNOSIS — Z8619 Personal history of other infectious and parasitic diseases: Secondary | ICD-10-CM | POA: Insufficient documentation

## 2014-02-18 DIAGNOSIS — Z880 Allergy status to penicillin: Secondary | ICD-10-CM | POA: Insufficient documentation

## 2014-02-18 DIAGNOSIS — R112 Nausea with vomiting, unspecified: Secondary | ICD-10-CM | POA: Insufficient documentation

## 2014-02-18 DIAGNOSIS — Z79899 Other long term (current) drug therapy: Secondary | ICD-10-CM | POA: Insufficient documentation

## 2014-02-18 DIAGNOSIS — M545 Low back pain, unspecified: Secondary | ICD-10-CM | POA: Insufficient documentation

## 2014-02-18 DIAGNOSIS — Z87891 Personal history of nicotine dependence: Secondary | ICD-10-CM

## 2014-02-18 DIAGNOSIS — R109 Unspecified abdominal pain: Secondary | ICD-10-CM

## 2014-02-18 DIAGNOSIS — B999 Unspecified infectious disease: Secondary | ICD-10-CM

## 2014-02-18 DIAGNOSIS — Z3202 Encounter for pregnancy test, result negative: Secondary | ICD-10-CM | POA: Insufficient documentation

## 2014-02-18 DIAGNOSIS — R1013 Epigastric pain: Secondary | ICD-10-CM | POA: Insufficient documentation

## 2014-02-18 DIAGNOSIS — Z792 Long term (current) use of antibiotics: Secondary | ICD-10-CM | POA: Insufficient documentation

## 2014-02-18 DIAGNOSIS — O26619 Liver and biliary tract disorders in pregnancy, unspecified trimester: Secondary | ICD-10-CM | POA: Insufficient documentation

## 2014-02-18 DIAGNOSIS — R51 Headache: Secondary | ICD-10-CM | POA: Insufficient documentation

## 2014-02-18 DIAGNOSIS — G8929 Other chronic pain: Secondary | ICD-10-CM | POA: Insufficient documentation

## 2014-02-18 LAB — COMPREHENSIVE METABOLIC PANEL
ALT: 16 U/L (ref 0–35)
ALT: 14 U/L (ref 0–35)
AST: 15 U/L (ref 0–37)
AST: 13 U/L (ref 0–37)
Albumin: 4.4 g/dL (ref 3.5–5.2)
Albumin: 4.1 g/dL (ref 3.5–5.2)
Alkaline Phosphatase: 81 U/L (ref 39–117)
Alkaline Phosphatase: 73 U/L (ref 39–117)
BILIRUBIN TOTAL: 0.2 mg/dL — AB (ref 0.3–1.2)
BUN: 9 mg/dL (ref 6–23)
BUN: 7 mg/dL (ref 6–23)
CALCIUM: 8.9 mg/dL (ref 8.4–10.5)
CO2: 27 meq/L (ref 19–32)
CO2: 27 meq/L (ref 19–32)
CREATININE: 0.82 mg/dL (ref 0.50–1.10)
Calcium: 9.5 mg/dL (ref 8.4–10.5)
Chloride: 100 mEq/L (ref 96–112)
Chloride: 103 mEq/L (ref 96–112)
Creatinine, Ser: 0.78 mg/dL (ref 0.50–1.10)
GLUCOSE: 150 mg/dL — AB (ref 70–99)
GLUCOSE: 82 mg/dL (ref 70–99)
Potassium: 4 mEq/L (ref 3.7–5.3)
Potassium: 3.5 mEq/L — ABNORMAL LOW (ref 3.7–5.3)
SODIUM: 142 meq/L (ref 137–147)
Sodium: 141 mEq/L (ref 137–147)
TOTAL PROTEIN: 7.5 g/dL (ref 6.0–8.3)
TOTAL PROTEIN: 7.1 g/dL (ref 6.0–8.3)
Total Bilirubin: 0.3 mg/dL (ref 0.3–1.2)

## 2014-02-18 LAB — CBC WITH DIFFERENTIAL/PLATELET
BASOS ABS: 0 10*3/uL (ref 0.0–0.1)
Basophils Absolute: 0 10*3/uL (ref 0.0–0.1)
Basophils Relative: 0 % (ref 0–1)
Basophils Relative: 1 % (ref 0–1)
EOS ABS: 0.4 10*3/uL (ref 0.0–0.7)
EOS ABS: 0.7 10*3/uL (ref 0.0–0.7)
Eosinophils Relative: 4 % (ref 0–5)
Eosinophils Relative: 8 % — ABNORMAL HIGH (ref 0–5)
HCT: 38.9 % (ref 36.0–46.0)
HEMATOCRIT: 39.5 % (ref 36.0–46.0)
HEMOGLOBIN: 13.3 g/dL (ref 12.0–15.0)
Hemoglobin: 13.7 g/dL (ref 12.0–15.0)
LYMPHS PCT: 21 % (ref 12–46)
Lymphocytes Relative: 33 % (ref 12–46)
Lymphs Abs: 2 10*3/uL (ref 0.7–4.0)
Lymphs Abs: 2.9 10*3/uL (ref 0.7–4.0)
MCH: 29.6 pg (ref 26.0–34.0)
MCH: 30.2 pg (ref 26.0–34.0)
MCHC: 34.2 g/dL (ref 30.0–36.0)
MCHC: 34.7 g/dL (ref 30.0–36.0)
MCV: 86.6 fL (ref 78.0–100.0)
MCV: 87.2 fL (ref 78.0–100.0)
MONOS PCT: 5 % (ref 3–12)
Monocytes Absolute: 0.5 10*3/uL (ref 0.1–1.0)
Monocytes Absolute: 0.6 10*3/uL (ref 0.1–1.0)
Monocytes Relative: 7 % (ref 3–12)
NEUTROS PCT: 69 % (ref 43–77)
Neutro Abs: 6.3 10*3/uL (ref 1.7–7.7)
Neutro Abs: 4.5 10*3/uL (ref 1.7–7.7)
Neutrophils Relative %: 52 % (ref 43–77)
PLATELETS: 235 10*3/uL (ref 150–400)
Platelets: 216 10*3/uL (ref 150–400)
RBC: 4.49 MIL/uL (ref 3.87–5.11)
RBC: 4.53 MIL/uL (ref 3.87–5.11)
RDW: 12.9 % (ref 11.5–15.5)
RDW: 13.1 % (ref 11.5–15.5)
WBC: 9.1 10*3/uL (ref 4.0–10.5)
WBC: 8.6 10*3/uL (ref 4.0–10.5)

## 2014-02-18 LAB — URINE MICROSCOPIC-ADD ON

## 2014-02-18 LAB — LIPASE, BLOOD
LIPASE: 17 U/L (ref 11–59)
Lipase: 22 U/L (ref 11–59)

## 2014-02-18 LAB — URINALYSIS, ROUTINE W REFLEX MICROSCOPIC
Bilirubin Urine: NEGATIVE
Bilirubin Urine: NEGATIVE
Glucose, UA: NEGATIVE mg/dL
Glucose, UA: NEGATIVE mg/dL
KETONES UR: NEGATIVE mg/dL
Ketones, ur: NEGATIVE mg/dL
NITRITE: NEGATIVE
NITRITE: NEGATIVE
PROTEIN: NEGATIVE mg/dL
PROTEIN: NEGATIVE mg/dL
SPECIFIC GRAVITY, URINE: 1.022 (ref 1.005–1.030)
Specific Gravity, Urine: 1.018 (ref 1.005–1.030)
UROBILINOGEN UA: 0.2 mg/dL (ref 0.0–1.0)
UROBILINOGEN UA: 0.2 mg/dL (ref 0.0–1.0)
pH: 5.5 (ref 5.0–8.0)
pH: 5 (ref 5.0–8.0)

## 2014-02-18 LAB — POC URINE PREG, ED
PREG TEST UR: NEGATIVE
Preg Test, Ur: NEGATIVE

## 2014-02-18 MED ORDER — SODIUM CHLORIDE 0.9 % IV BOLUS (SEPSIS)
1000.0000 mL | INTRAVENOUS | Status: AC
  Administered 2014-02-18: 1000 mL via INTRAVENOUS

## 2014-02-18 MED ORDER — GI COCKTAIL ~~LOC~~
30.0000 mL | Freq: Once | ORAL | Status: AC
  Administered 2014-02-18: 30 mL via ORAL
  Filled 2014-02-18: qty 30

## 2014-02-18 MED ORDER — MORPHINE SULFATE 4 MG/ML IJ SOLN
4.0000 mg | Freq: Once | INTRAMUSCULAR | Status: AC
  Administered 2014-02-18: 4 mg via INTRAVENOUS
  Filled 2014-02-18: qty 1

## 2014-02-18 MED ORDER — ONDANSETRON HCL 4 MG PO TABS: 4.0000 mg | ORAL_TABLET | Freq: Four times a day (QID) | ORAL | Status: DC

## 2014-02-18 MED ORDER — MORPHINE SULFATE 4 MG/ML IJ SOLN: 6.0000 mg | Freq: Once | INTRAMUSCULAR | Status: DC

## 2014-02-18 MED ORDER — OMEPRAZOLE 20 MG PO CPDR: 20.0000 mg | DELAYED_RELEASE_CAPSULE | Freq: Two times a day (BID) | ORAL | Status: DC

## 2014-02-18 MED ORDER — ONDANSETRON HCL 4 MG/2ML IJ SOLN
4.0000 mg | INTRAMUSCULAR | Status: AC
  Administered 2014-02-18: 4 mg via INTRAVENOUS
  Filled 2014-02-18: qty 2

## 2014-02-18 MED ORDER — TRAMADOL HCL 50 MG PO TABS: 50.0000 mg | ORAL_TABLET | Freq: Four times a day (QID) | ORAL | Status: DC | PRN

## 2014-02-18 NOTE — Discharge Instructions (Signed)
Abdominal Pain, Adult °Many things can cause abdominal pain. Usually, abdominal pain is not caused by a disease and will improve without treatment. It can often be observed and treated at home. Your health care provider will do a physical exam and possibly order blood tests and X-rays to help determine the seriousness of your pain. However, in many cases, more time must pass before a clear cause of the pain can be found. Before that point, your health care provider may not know if you need more testing or further treatment. °HOME CARE INSTRUCTIONS  °Monitor your abdominal pain for any changes. The following actions may help to alleviate any discomfort you are experiencing: °· Only take over-the-counter or prescription medicines as directed by your health care provider. °· Do not take laxatives unless directed to do so by your health care provider. °· Try a clear liquid diet (broth, tea, or water) as directed by your health care provider. Slowly move to a bland diet as tolerated. °SEEK MEDICAL CARE IF: °· You have unexplained abdominal pain. °· You have abdominal pain associated with nausea or diarrhea. °· You have pain when you urinate or have a bowel movement. °· You experience abdominal pain that wakes you in the night. °· You have abdominal pain that is worsened or improved by eating food. °· You have abdominal pain that is worsened with eating fatty foods. °SEEK IMMEDIATE MEDICAL CARE IF:  °· Your pain does not go away within 2 hours. °· You have a fever. °· You keep throwing up (vomiting). °· Your pain is felt only in portions of the abdomen, such as the right side or the left lower portion of the abdomen. °· You pass bloody or black tarry stools. °MAKE SURE YOU: °· Understand these instructions.   °· Will watch your condition.   °· Will get help right away if you are not doing well or get worse.   °Document Released: 09/14/2005 Document Revised: 09/25/2013 Document Reviewed: 08/14/2013 °Exit[employer]® Patient  Information ©2014 [employer]. ° °

## 2014-02-18 NOTE — ED Provider Notes (Signed)
CSN: [id number]     Arrival date & time 02/18/14  1423 History   First MD Initiated Contact with Patient 02/18/14 1521     Chief Complaint  Patient presents with  . Abdominal Pain  . Nausea  . Emesis     (Consider location/radiation/quality/duration/timing/severity/associated sxs/prior Treatment) HPI Pt is a 33yo female with hx of chronic abdominal pain that has worsened since last Wednesday, 2/25, was evaluated for same on 2/28, dx with pancreatitis and advised to f/u with PCP or GI specialist. Pt states she was evaluated by her OB/GYN Monday, 3/2 and stated pelvic exam was "okay," she then f/u with PCP earlier today due to continued abdominal pain, nausea, and vomiting, stating she cannot keep anything down. Pt states PCP sent pt here for fluids and to see GI specialist.  Pain is constant, feels the same as her chronic abdominal pain but more intense. Pain is dull, aching, and occasionally sharp and cramping, 10/10. Last episode of emesis around 11am this morning. Has been able to keep down some fluids today but was unable to keep them down yesterday. Complaining of associated headache and worsening chronic lower back pain.  Reports last BM earlier this morning. Denies diarrhea or constipation. Denies fevers. Denies previous hx of pancreatitis, alcohol use, peptic ulcers, diverticulitis, or gallstones.   PCP: [facility]   Past Medical History  Diagnosis Date  . Cholestasis of pregnancy   . 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 Wisdom tooth extraction    . No past surgeries     Family History  Problem Relation Age of Onset  . Arthritis Mother   . Asthma Mother   . Depression Mother   . Hypertension Mother   . Miscarriages / Stillbirths Maternal Aunt   . Stroke Paternal Uncle   . Heart disease Paternal Uncle   . Cancer Maternal Grandfather     kidney  . Heart disease Paternal Grandmother     History  Substance Use Topics  . Smoking status: Former Games developer  . Smokeless tobacco: Never Used  . Alcohol Use: No   OB History   Grav Para Term Preterm Abortions TAB SAB Ect Mult Living   2 1 1  0 1 1 0 0 0 1     Review of Systems  Constitutional: Negative for fever and chills.  Gastrointestinal: Positive for nausea, vomiting and abdominal pain. Negative for diarrhea.  All other systems reviewed and are negative.      Allergies  Penicillins  Home Medications   Current Outpatient Rx  Name  Route  Sig  Dispense  Refill  . HYDROcodone-acetaminophen (NORCO/VICODIN) 5-325 MG per tablet   Oral   Take 1-2 tablets by mouth every 4 (four) hours as needed for moderate pain.   10 tablet   0   . promethazine (PHENERGAN) 25 MG tablet   Oral   Take 1 tablet (25 mg total) by mouth every 6 (six) hours as needed for nausea.   20 tablet   0   . ciprofloxacin (CIPRO) 500 MG tablet   Oral   Take 1 tablet (500 mg total) by mouth 2 (two) times daily.   14 tablet   0   . metroNIDAZOLE (FLAGYL) 500 MG tablet   Oral   Take 1 tablet (500 mg total) by mouth 2 (two) times daily.   14 tablet   0   .  ondansetron (ZOFRAN) 4 MG tablet   Oral   Take 1 tablet (4 mg total) by mouth every 6 (six) hours.   12 tablet   0   . traMADol (ULTRAM) 50 MG tablet   Oral   Take 1 tablet (50 mg total) by mouth every 6 (six) hours as needed.   15 tablet   0    BP 120/74  Pulse 87  Temp(Src) 98.6 F (37 C) (Oral)  Resp 16  SpO2 100%  LMP 01/22/2014 Physical Exam  Nursing note and vitals reviewed. Constitutional: She appears well-developed and well-nourished. No distress.  Pt lying in exam bed, NAD  HENT:  Head: Normocephalic and atraumatic.  Eyes: Conjunctivae are normal. No scleral icterus.  Neck: Normal range of motion. Neck supple.  Cardiovascular: Normal rate, regular rhythm and normal heart sounds.   Pulmonary/Chest: Effort normal and breath sounds normal. No respiratory  distress. She has no wheezes. She has no rales. She exhibits no tenderness.  Abdominal: Soft. Bowel sounds are normal. She exhibits no distension and no mass. There is tenderness. There is no rebound and no guarding.  Diffuse tenderness, greater in epigastrium and left side of abdomen without rebound, guarding, or masses  Musculoskeletal: Normal range of motion.  Neurological: She is alert.  Skin: Skin is warm and dry. She is not diaphoretic.    ED Course  Procedures (including critical care time) Labs Review Labs Reviewed  COMPREHENSIVE METABOLIC PANEL - Abnormal; Notable for the following:    Glucose, Bld 150 (*)    Total Bilirubin 0.2 (*)    All other components within normal limits  URINALYSIS, ROUTINE W REFLEX MICROSCOPIC - Abnormal; Notable for the following:    APPearance CLOUDY (*)    Hgb urine dipstick LARGE (*)    Leukocytes, UA SMALL (*)    All other components within normal limits  URINE MICROSCOPIC-ADD ON - Abnormal; Notable for the following:    Bacteria, UA FEW (*)    All other components within normal limits  CBC WITH DIFFERENTIAL  LIPASE, BLOOD  POC URINE PREG, ED   Imaging Review No results found.   EKG Interpretation None      MDM   Final diagnoses:  Abdominal pain  Nausea & vomiting    pt with hx of chronic abdominal pain and recent dx of pancreatitis on 2/28, presenting to ED from PCP for IV fluids stating she cannot keep anything down. Pt also c/o continued abdominal pain. Pt appears well, non-toxic. Vitals: WNL.  Will repeat labs: CBC, CMP, Lipase, UA, and Urine Preg.  Reviewed pt's medical chart and imaging.  She had negative abd U/S and CT abd/pelvis.   Pt reports BM this morning, denies constipation. No obvious distention. Abd soft, diffuse tenderness, no focal tenderness. Not concerned for surgical abdomen, including not concerned for SBO. Do not believe repeat imaging would be of benefit at this time. Will tx pain and give fluids in ED.  Tx:  IV fluids, morphine, zofran and GI cocktail.  Labs: unremarkable, Lipase-17, decreased from 143 two days ago.    Fluid challenge: 5:16 PM- pt able to keep down several ounces of gingerale and crackers. Pt states she feels comfortable being discharged home but is concerned she cannot f/u with GI specialist for 65mo.  Advised pt to continue to f/u with PCP and call specialist to see if pt can be put on waiting list for possible canceled appointments.  Rx: zofran and tramadol. Return precautions provided. Pt verbalized understanding  and agreement with tx plan.      Junius Finner, PA-C 02/18/14 1806

## 2014-02-18 NOTE — ED Notes (Signed)
Pt given PO challenge, tolerating at this time.

## 2014-02-18 NOTE — ED Notes (Signed)
MD informed of pt's abdominal distention.

## 2014-02-18 NOTE — Discharge Instructions (Signed)
Today you were reevaluated for abdominal pain, nausea, and vomiting. Your vital signs and labs do not indicate any emergent process taking place at this time.  Be sure to follow up with primary care and [facility] for further evaluation of continued abdominal symptoms.  If you find it difficult to keep hydrated with fluids, you may find ice chips or ice pops to be more gentle and soothing on your stomach.  Take Zofran as needed for nausea.  Refer to further instructions below.   Abdominal Pain, Adult Many things can cause belly (abdominal) pain. Most times, the belly pain is not dangerous. Many cases of belly pain can be watched and treated at home. HOME CARE   Do not take medicines that help you go poop (laxatives) unless told to by your doctor.  Only take medicine as told by your doctor.  Eat or drink as told by your doctor. Your doctor will tell you if you should be on a special diet. GET HELP IF:  You do not know what is causing your belly pain.  You have belly pain while you are sick to your stomach (nauseous) or have runny poop (diarrhea).  You have pain while you pee or poop.  Your belly pain wakes you up at night.  You have belly pain that gets worse or better when you eat.  You have belly pain that gets worse when you eat fatty foods. GET HELP RIGHT AWAY IF:   The pain does not go away within 2 hours.  You have a fever.  You keep throwing up (vomiting).  The pain changes and is only in the right or left part of the belly.  You have bloody or tarry looking poop. MAKE SURE YOU:   Understand these instructions.  Will watch your condition.  Will get help right away if you are not doing well or get worse. Document Released: 05/23/2008 Document Revised: 09/25/2013 Document Reviewed: 08/14/2013 [facility] Patient Information 2014 [employer] [place].

## 2014-02-18 NOTE — ED Provider Notes (Signed)
CSN: [id number]     Arrival date & time 02/18/14  1922 History   First MD Initiated Contact with Patient 02/18/14 2113     Chief Complaint  Patient presents with  . Abdominal Pain     (Consider location/radiation/quality/duration/timing/severity/associated sxs/prior Treatment) Patient is a 33 y.o. female presenting with abdominal pain.  Abdominal Pain Pain location:  Generalized Pain quality: aching and sharp   Pain radiates to:  Does not radiate Pain severity:  Severe Onset quality:  Gradual Duration: over a week. Timing:  Constant Progression:  Unchanged Chronicity:  New Context comment:  Has been evaluated during several ED visits.  Had a CT last week which was unremarkable, had RUQ US 2 days ago which was unremarkable.  Had mildly elevated lipase last week and was treated for pancreatitis.  Relieved by:  Nothing Ineffective treatments: tramadol, phenergan. Associated symptoms: nausea and vomiting   Associated symptoms: no constipation, no diarrhea, no dysuria, no fever and no shortness of breath     Past Medical History  Diagnosis Date  . Cholestasis of pregnancy   . 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 Wisdom tooth extraction    . No past surgeries     Family History  Problem Relation Age of Onset  . Arthritis Mother   . Asthma Mother   . Depression Mother   . Hypertension Mother   . Miscarriages / Stillbirths Maternal Aunt   . Stroke Paternal Uncle   . Heart disease Paternal Uncle   . Cancer Maternal Grandfather     kidney  . Heart disease Paternal Grandmother    History  Substance Use Topics  . Smoking status: Former Games developermoker  . Smokeless tobacco: Never Used  . Alcohol Use: No   OB History   Grav Para Term Preterm Abortions TAB SAB Ect Mult Living   2 1 1  0 1 1 0 0 0 1     Review of Systems  Constitutional: Negative for fever.  Respiratory: Negative for shortness of breath.    Gastrointestinal: Positive for nausea, vomiting and abdominal pain. Negative for diarrhea and constipation.  Genitourinary: Negative for dysuria.  All other systems reviewed and are negative.      Allergies  Penicillins  Home Medications   Current Outpatient Rx  Name  Route  Sig  Dispense  Refill  . HYDROcodone-acetaminophen (NORCO/VICODIN) 5-325 MG per tablet   Oral   Take 1-2 tablets by mouth every 4 (four) hours as needed for moderate pain.   10 tablet   0   . promethazine (PHENERGAN) 25 MG tablet   Oral   Take 1 tablet (25 mg total) by mouth every 6 (six) hours as needed for nausea.   20 tablet   0    BP 114/64  Pulse 67  Temp(Src) 98.6 F (37 C) (Oral)  Resp 16  Ht 5\' 2"  (1.575 m)  Wt 169 lb (76.658 kg)  BMI 30.90 kg/m2  SpO2 100%  LMP 02/18/2014 Physical Exam  Nursing note and vitals reviewed. Constitutional: She is oriented to person, place, and time. She appears well-developed and well-nourished. No distress.  HENT:  Head: Normocephalic and atraumatic.  Mouth/Throat: Oropharynx is clear and moist.  Eyes: Conjunctivae are normal. Pupils are equal, round, and reactive to light. No scleral icterus.  Neck: Neck supple.  Cardiovascular: Normal rate, regular rhythm, normal heart sounds and intact distal pulses.   No  murmur heard. Pulmonary/Chest: Effort normal and breath sounds normal. No stridor. No respiratory distress. She has no rales.  Abdominal: Soft. Bowel sounds are normal. She exhibits no distension. There is tenderness in the epigastric area and left upper quadrant. There is no rigidity, no rebound and no guarding.  Musculoskeletal: Normal range of motion.  Neurological: She is alert and oriented to person, place, and time.  Skin: Skin is warm and dry. No rash noted.  Psychiatric: She has a normal mood and affect. Her behavior is normal.    ED Course  Procedures (including critical care time) Labs Review Labs Reviewed  CBC WITH DIFFERENTIAL -  Abnormal; Notable for the following:    Eosinophils Relative 8 (*)    All other components within normal limits  COMPREHENSIVE METABOLIC PANEL - Abnormal; Notable for the following:    Potassium 3.5 (*)    All other components within normal limits  URINALYSIS, ROUTINE W REFLEX MICROSCOPIC - Abnormal; Notable for the following:    APPearance CLOUDY (*)    Hgb urine dipstick LARGE (*)    Leukocytes, UA TRACE (*)    All other components within normal limits  URINE MICROSCOPIC-ADD ON - Abnormal; Notable for the following:    Squamous Epithelial / LPF MANY (*)    Bacteria, UA FEW (*)    All other components within normal limits  LIPASE, BLOOD  POC URINE PREG, ED   Imaging Review No results found.   EKG Interpretation None      MDM   Final diagnoses:  Abdominal pain  Nausea & vomiting    33 yo female with over a week of abdominal pain.  States pain is generalized, but is tender in epigastrium and LUQ.  abd soft without peritoneal signs.  Had an elevated lipase several days ago, but since then her lipase has normalized.  Source of her pain is still unclear.  However, given her well appearance, normal vital signs, normal lab data, and benign exam, I do not think repeat imaging would be helpful.  Will start PPI, as symptoms could be secondary to gastritis/PUD.  Will DC home with close PCP follow up.      Candyce Churn III, MD 02/19/14 [phone]

## 2014-02-18 NOTE — ED Notes (Signed)
Pt w/ hx of pancreatitis.  Pt states she was sent here by her PCP for fluids.  States that she cannot keep anything down.  Denies alcoholic pancreatitis.

## 2014-02-18 NOTE — ED Notes (Signed)
Patient presents with c/o abd pain.  Has been treated with pancreatitis.  [facility] and given [facility] and pain med  Abd swollen

## 2014-02-19 NOTE — ED Provider Notes (Signed)
Medical screening examination/treatment/procedure(s) were performed by non-physician practitioner and as supervising physician I was immediately available for consultation/collaboration.   EKG Interpretation None        Padraig Nhan, MD 02/19/14 2308 

## 2014-02-26 NOTE — ED Provider Notes (Signed)
Medical screening examination/treatment/procedure(s) were performed by non-physician practitioner and as supervising physician I was immediately available for consultation/collaboration.   EKG Interpretation None        Jonh Mcqueary, MD 02/26/14 0650 

## 2014-03-04 ENCOUNTER — Other Ambulatory Visit: Payer: Self-pay | Admitting: Gastroenterology

## 2014-03-04 DIAGNOSIS — R1013 Epigastric pain: Secondary | ICD-10-CM

## 2014-03-12 ENCOUNTER — Ambulatory Visit (HOSPITAL_COMMUNITY)
Admission: RE | Admit: 2014-03-12 | Discharge: 2014-03-12 | Disposition: A | Discharge status: Home / Self Care | Payer: No Typology Code available for payment source | Source: Ambulatory Visit | Attending: Gastroenterology | Admitting: Gastroenterology

## 2014-03-12 DIAGNOSIS — R1013 Epigastric pain: Secondary | ICD-10-CM | POA: Insufficient documentation

## 2014-03-12 DIAGNOSIS — K3189 Other diseases of stomach and duodenum: Secondary | ICD-10-CM | POA: Insufficient documentation

## 2014-03-12 MED ORDER — TECHNETIUM TC 99M SULFUR COLLOID
2.0000 | Freq: Once | INTRAVENOUS | Status: AC | PRN
  Administered 2014-03-12: 2 via ORAL

## 2014-03-14 ENCOUNTER — Ambulatory Visit (HOSPITAL_COMMUNITY): Payer: No Typology Code available for payment source

## 2014-03-18 ENCOUNTER — Encounter: Payer: Self-pay | Admitting: Gastroenterology

## 2014-03-18 ENCOUNTER — Encounter (INDEPENDENT_AMBULATORY_CARE_PROVIDER_SITE_OTHER): Payer: Self-pay

## 2014-03-18 ENCOUNTER — Ambulatory Visit (INDEPENDENT_AMBULATORY_CARE_PROVIDER_SITE_OTHER): Payer: No Typology Code available for payment source | Admitting: Gastroenterology

## 2014-03-18 VITALS — BP 150/89 | HR 105 | Temp 97.6°F | Ht 62.0 in | Wt 158.6 lb

## 2014-03-18 DIAGNOSIS — R5383 Other fatigue: Secondary | ICD-10-CM

## 2014-03-18 DIAGNOSIS — R112 Nausea with vomiting, unspecified: Secondary | ICD-10-CM

## 2014-03-18 DIAGNOSIS — R1013 Epigastric pain: Secondary | ICD-10-CM

## 2014-03-18 DIAGNOSIS — R634 Abnormal weight loss: Secondary | ICD-10-CM

## 2014-03-18 DIAGNOSIS — K3184 Gastroparesis: Secondary | ICD-10-CM

## 2014-03-18 DIAGNOSIS — R5381 Other malaise: Secondary | ICD-10-CM

## 2014-03-18 LAB — T4, FREE: FREE T4: 1.21 ng/dL (ref 0.80–1.80)

## 2014-03-18 LAB — TSH: TSH: 0.602 u[IU]/mL (ref 0.350–4.500)

## 2014-03-18 MED ORDER — HYDROCODONE-ACETAMINOPHEN 5-325 MG PO TABS: 1.0000 | ORAL_TABLET | Freq: Four times a day (QID) | ORAL | Status: DC | PRN

## 2014-03-18 MED ORDER — ONDANSETRON HCL 4 MG PO TABS: 4.0000 mg | ORAL_TABLET | Freq: Three times a day (TID) | ORAL | Status: DC | PRN

## 2014-03-18 NOTE — Patient Instructions (Signed)
1. Prescription for Vicodin for moderate to severe pain only. This can worsen your gastric emptying. 2. Zofran as needed for N/V. 3. Please have your blood work done. 4. Once I have reviewed your records from Dr. Elnoria HowardHung, we will call you with further recommendations. 5. If you start Reglan, take 5mg  up to four times daily before meals. Hopefully this will be short term only. Watch for discussed side effects. 6. Out of work today and tomorrow.  7. Drink plenty of fluids to keep your urine light yellow.

## 2014-03-18 NOTE — Progress Notes (Addendum)
Primary Care Physician:  Cassell Smiles., MD  Primary Gastroenterologist:  Roetta Sessions, MD   Chief Complaint  Patient presents with  . Abdominal Pain    HPI:  Kiara Dillon is a 33 y.o. female here for a second opinion. She was referred originially by her PCP for further evaluation of abdominal pain/bloating, N/V. In the interim, she was referred to Dr. Jeani Hawking by her OB/GYN. She had an EGD and colonoscopy which revealed what sounds like retained food in the stomach but otherwise unremarkable. She then had a gastric emptying study which is borderline normal.   Symptoms started about 6 weeks ago. Started with severe abdominal pain and abdominal bloating, associated with N/V. States her pain begins in the right lower cord region radiates upward into the epigastrium and left upper quadrant. Seems to feel this way regardless of what she eats. Will go to all day without food still has symptoms but definitely worse with meals. Complains of early satiety. No hematemesis. Complains of 10 pound weight loss in 2 weeks. Feels significantly fatigued. Works as a Teacher, adult education, used to work 2 jobs but over the past 2 months can barely make it to one job. States she had to go to urgent care because she couldn't breathe, told she had asthma attack and was given an inhaler. No prior history of asthma. Bowel movement daily, loose stools. Long history of intermittent diarrhea and constipation. Denies melena or rectal bleeding. Was on Prevacid twice a day for about a week, told to stop it by Dr. hung up per patient. Complains of new onset heartburn. Continues to have frequent vomiting.  States Dr. Elnoria Howard gave her Reglan to try but she was afraid to take it.   Current Outpatient Prescriptions  Medication Sig Dispense Refill  . ciprofloxacin (CIPRO) 500 MG tablet Take 500 mg by mouth daily with breakfast.        No current facility-administered medications for this visit.    Allergies as of 03/18/2014  - Review Complete 03/18/2014  Allergen Reaction Noted  . Penicillins Other (See Comments) 09/29/2011    Past Medical History  Diagnosis Date  . Cholestasis of pregnancy   . 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 Wisdom tooth extraction    . No past surgeries      Family History  Problem Relation Age of Onset  . Arthritis Mother   . Asthma Mother   . Depression Mother   . Hypertension Mother   . Miscarriages / Stillbirths Maternal Aunt   . Stroke Paternal Uncle   . Heart disease Paternal Uncle   . Cancer Maternal Grandfather     kidney  . Heart disease Paternal Grandmother     History   Social History  . Marital Status: Single    Spouse Name: N/A    Number of Children: N/A  . Years of Education: N/A   Occupational History  . Not on file.   Social History Main Topics  . Smoking status: Former Games developer  . Smokeless tobacco: Never Used  . Alcohol Use: No  . Drug Use: No  . Sexual Activity: Yes    Birth Control/ Protection: None   Other Topics Concern  . Not on file   Social History Narrative  . No narrative on file      ROS:  General: See history of present illness Eyes: Negative for vision changes.  ENT: Negative for hoarseness, difficulty swallowing , nasal congestion. CV: Negative for chest pain, angina, palpitations, dyspnea on exertion, peripheral edema.  Respiratory: Negative for dyspnea at rest, dyspnea on exertion, cough, sputum, wheezing.  GI: See history of present illness. GU:  Negative for dysuria, hematuria, urinary incontinence, urinary frequency, nocturnal urination.  MS: Negative for joint pain. Chronic low back pain.  Derm: Negative for rash or itching.  Neuro: Negative for weakness, abnormal sensation, seizure, frequent headaches, memory loss, confusion.  Psych: Negative for anxiety, depression, suicidal ideation, hallucinations.  Endo: See history of present illness   Heme: Negative for bruising or bleeding. Allergy: Negative for rash or hives.    Physical Examination:  BP 150/89  Pulse 105  Temp(Src) 97.6 F (36.4 C) (Oral)  Ht 5\' 2"  (1.575 m)  Wt 158 lb 9.6 oz (71.94 kg)  BMI 29.00 kg/m2  LMP 02/18/2014   General: Well-nourished, well-developed in no acute distress.  Head: Normocephalic, atraumatic.   Eyes: Conjunctiva pink, no icterus. Mouth: Oropharyngeal mucosa moist and pink , no lesions erythema or exudate. Neck: Supple without thyromegaly, masses, or lymphadenopathy.  Lungs: Clear to auscultation bilaterally.  Heart: Regular rate and rhythm, no murmurs rubs or gallops.  Abdomen: Bowel sounds are normal, epigastric/left upper quadrant tenderness, feels full in the periumbilical region with slight distention, no hepatosplenomegaly or masses, no abdominal bruits or    hernia , no rebound or guarding.   Rectal: not performed Extremities: No lower extremity edema. No clubbing or deformities.  Neuro: Alert and oriented x 4 , grossly normal neurologically.  Skin: Warm and dry, no rash or jaundice.   Psych: Alert and cooperative, normal mood and affect.  Labs: Lab Results  Component Value Date   WBC 8.6 02/18/2014   HGB 13.7 02/18/2014   HCT 39.5 02/18/2014   MCV 87.2 02/18/2014   PLT 235 02/18/2014   Lab Results  Component Value Date   CREATININE 0.82 02/18/2014   BUN 7 02/18/2014   NA 142 02/18/2014   K 3.5* 02/18/2014   CL 103 02/18/2014   CO2 27 02/18/2014   Lab Results  Component Value Date   ALT 14 02/18/2014   AST 13 02/18/2014   ALKPHOS 73 02/18/2014   BILITOT 0.3 02/18/2014    Component     Latest Ref Rng 02/16/2014 02/18/2014 02/18/2014          2:46 PM  8:13 PM  Lipase     11 - 59 U/L 143 (H) 17 22   Imaging Studies: Nm Gastric Emptying  03/12/2014   CLINICAL DATA:  Epigastric abdominal pain.  EXAM: NUCLEAR MEDICINE GASTRIC EMPTYING SCAN  TECHNIQUE: After oral ingestion of radiolabeled meal, sequential abdominal images were obtained for  120 minutes. Residual percentage of activity remaining within the stomach was calculated at 60 and 120 minutes.  RADIOPHARMACEUTICALS:  2.0Technetium 99-m labeled sulfur colloid  COMPARISON:  US ABDOMEN COMPLETE dated 02/16/2014; CT ABD/PELVIS W CM dated 02/11/2014  FINDINGS: Expected location of the stomach in the left upper quadrant. Ingested meal empties the stomach gradually over the course of the study with 74% retention at 60 min and 31% retention at 120 min (normal retention less than 30% at a 120 min).  IMPRESSION: Borderline to minimally delayed gastric emptying.   Electronically Signed   By: Jeronimo GreavesKyle  Talbot M.D.   On: 03/12/2014 13:28    Abd U/S: 02/16/14 IMPRESSION:  1. No acute abnormality seen within the abdomen. The pancreas is  incompletely assessed due to  overlying bowel gas. Visualized  portions of the pancreas are grossly unremarkable in appearance.  2. Mild fatty infiltration within the liver.  CT A/P with contrast 02/11/14 IMPRESSION:  1. Bladder wall thickening may simply reflect decompression, or  possibly cystitis, given the patient's white blood cells in the  urine.  2. Otherwise unremarkable CT of the abdomen and pelvis.  US pelvis/Transvaginal 02/11/14 IMPRESSION:  Trace amount of free pelvic fluid which may be physiologic.  Otherwise unremarkable exam.

## 2014-03-18 NOTE — Assessment & Plan Note (Addendum)
33 year old lady with 6-8 week history of abdominal pain associated with abdominal bloating, vomiting, weight loss. Limited solid food intake given recurrent nausea and vomiting. Complains of abdominal bloating and discomfort even with drinking liquids only. Abdominal ultrasound, pelvic/transvaginal ultrasound, CT abdomen pelvis all fail to reveal cause of her symptoms. unremarkable. She reports recent EGD and colonoscopy with findings only to suggest gastroparesis. Gastric emptying study borderline abnormal.  Doubt all of her symptoms especially abdominal pain could be explained by borderline gastroparesis. Etiology of gastroparesis would be unclear given no history of diabetes or chronic medications. Need to check thyroid status. Will retrieve copy of her EGD and colonoscopy reports. Provide supportive measures only have anti-medics and pain medication (recommend to limit to only moderate to severe pain only as this could worsen her gastric emptying). Discussed with patient how she had significant pancreatitis with mildly elevated lipase which could be nonspecific in this setting. May consider HIDA scan as next step once her records are reviewed. If she decides to try Reglan recommend low dose of 5 mg 3 times a day to 4 times a day (before meals and at bedtime) short-term only.  We would like to thank you for the opportunity to participate in the care of Kiara Dillon.   Further recommendations to follow.

## 2014-03-19 NOTE — Progress Notes (Signed)
cc'd to pcp 

## 2014-03-24 ENCOUNTER — Telehealth: Payer: Self-pay | Admitting: *Deleted

## 2014-03-24 NOTE — Telephone Encounter (Signed)
Pt called about her blood work results pt said she had blood work done last Tuesday. Please advise [phone]

## 2014-03-25 NOTE — Telephone Encounter (Signed)
Called and LMOM that I will let Verlon AuLeslie know that she has called for results.

## 2014-03-26 ENCOUNTER — Encounter: Payer: Self-pay | Admitting: Gastroenterology

## 2014-03-26 NOTE — Progress Notes (Signed)
Quick Note:  LMOM for a return call. ______ 

## 2014-03-26 NOTE — Telephone Encounter (Signed)
See result note.  

## 2014-03-27 ENCOUNTER — Telehealth: Payer: Self-pay | Admitting: *Deleted

## 2014-03-27 ENCOUNTER — Other Ambulatory Visit: Payer: Self-pay | Admitting: Gastroenterology

## 2014-03-27 DIAGNOSIS — R112 Nausea with vomiting, unspecified: Secondary | ICD-10-CM

## 2014-03-27 DIAGNOSIS — R1013 Epigastric pain: Secondary | ICD-10-CM

## 2014-03-27 NOTE — Progress Notes (Signed)
Quick Note:  Called pt and informed of the results. I had sent another note ( in phone note). Pt has not been taking Reglan, was skeptical and wanted to wait until after she got results. Wants to know if she should start on it now? She is still having the abdominal pain and vomiting and is out of work today because of it. Please advise about HIDA SCAN. ______

## 2014-03-27 NOTE — Telephone Encounter (Signed)
Pt called back to speak with Tyler Aasoris about her results. Please advise [phone]

## 2014-03-27 NOTE — Telephone Encounter (Signed)
HIDA is scheduled for Tuesday 04/01/14 at 8:00 am and patient is aware

## 2014-03-27 NOTE — Telephone Encounter (Signed)
Called and informed pt.  

## 2014-03-27 NOTE — Telephone Encounter (Signed)
Let's go ahead with HIDA> Start Reglan 5mg  TID before meals. OV with [facility].

## 2014-03-27 NOTE — Telephone Encounter (Signed)
Kiara Dillon, I called the pt and gave her the results. I could not get back to the chart at the correct place to document there.  She said she is still having the abdominal pain and vomiting, she is out of work today because of it.  She did not start the Reglan, she was skeptical and also wanted to hear from the other tests first.She wants to know do you want her to start it now.  How about the HIDA?  Please advise!

## 2014-03-28 NOTE — Progress Notes (Signed)
Quick Note:  See phone note of 4/9 2015. ______

## 2014-04-01 ENCOUNTER — Encounter (HOSPITAL_COMMUNITY)
Admission: RE | Admit: 2014-04-01 | Discharge: 2014-04-01 | Disposition: A | Discharge status: Home / Self Care | Payer: No Typology Code available for payment source | Source: Ambulatory Visit | Attending: Gastroenterology | Admitting: Gastroenterology

## 2014-04-01 ENCOUNTER — Encounter (HOSPITAL_COMMUNITY): Payer: Self-pay

## 2014-04-01 DIAGNOSIS — R1013 Epigastric pain: Secondary | ICD-10-CM | POA: Insufficient documentation

## 2014-04-01 DIAGNOSIS — R112 Nausea with vomiting, unspecified: Secondary | ICD-10-CM | POA: Insufficient documentation

## 2014-04-01 HISTORY — DX: Unspecified asthma, uncomplicated: J45.909

## 2014-04-01 MED ORDER — STERILE WATER FOR INJECTION IJ SOLN
INTRAMUSCULAR | Status: AC
  Administered 2014-04-01: 1.43 mL via INTRAVENOUS
  Filled 2014-04-01: qty 10

## 2014-04-01 MED ORDER — SINCALIDE 5 MCG IJ SOLR
INTRAMUSCULAR | Status: AC
  Administered 2014-04-01: 1.43 ug via INTRAVENOUS
  Filled 2014-04-01: qty 5

## 2014-04-01 MED ORDER — TECHNETIUM TC 99M MEBROFENIN IV KIT
5.0000 | PACK | Freq: Once | INTRAVENOUS | Status: AC | PRN
  Administered 2014-04-01: 5 via INTRAVENOUS

## 2014-04-02 ENCOUNTER — Other Ambulatory Visit (HOSPITAL_COMMUNITY): Payer: Self-pay | Admitting: Family Medicine

## 2014-04-02 DIAGNOSIS — M545 Low back pain, unspecified: Secondary | ICD-10-CM

## 2014-04-02 DIAGNOSIS — R937 Abnormal findings on diagnostic imaging of other parts of musculoskeletal system: Secondary | ICD-10-CM

## 2014-04-04 ENCOUNTER — Ambulatory Visit (HOSPITAL_COMMUNITY): Admission: RE | Admit: 2014-04-04 | Payer: No Typology Code available for payment source | Source: Ambulatory Visit

## 2014-04-04 ENCOUNTER — Encounter (HOSPITAL_COMMUNITY): Payer: Self-pay

## 2014-04-04 ENCOUNTER — Ambulatory Visit (HOSPITAL_COMMUNITY)
Admission: RE | Admit: 2014-04-04 | Discharge: 2014-04-04 | Disposition: A | Discharge status: Home / Self Care | Payer: No Typology Code available for payment source | Source: Ambulatory Visit | Attending: Family Medicine | Admitting: Family Medicine

## 2014-04-04 DIAGNOSIS — M545 Low back pain, unspecified: Secondary | ICD-10-CM | POA: Insufficient documentation

## 2014-04-04 DIAGNOSIS — M5126 Other intervertebral disc displacement, lumbar region: Secondary | ICD-10-CM | POA: Insufficient documentation

## 2014-04-04 DIAGNOSIS — M48061 Spinal stenosis, lumbar region without neurogenic claudication: Secondary | ICD-10-CM | POA: Insufficient documentation

## 2014-04-04 DIAGNOSIS — R937 Abnormal findings on diagnostic imaging of other parts of musculoskeletal system: Secondary | ICD-10-CM

## 2014-04-08 ENCOUNTER — Other Ambulatory Visit: Payer: Self-pay | Admitting: Gastroenterology

## 2014-04-08 DIAGNOSIS — K828 Other specified diseases of gallbladder: Secondary | ICD-10-CM

## 2014-04-10 ENCOUNTER — Encounter (HOSPITAL_COMMUNITY)
Admission: RE | Admit: 2014-04-10 | Discharge: 2014-04-10 | Disposition: A | Discharge status: Home / Self Care | Payer: No Typology Code available for payment source | Source: Ambulatory Visit | Attending: General Surgery | Admitting: General Surgery

## 2014-04-10 ENCOUNTER — Other Ambulatory Visit: Payer: Self-pay

## 2014-04-10 ENCOUNTER — Encounter (HOSPITAL_COMMUNITY): Payer: Self-pay

## 2014-04-10 ENCOUNTER — Encounter (HOSPITAL_COMMUNITY): Payer: Self-pay | Admitting: Pharmacy Technician

## 2014-04-10 DIAGNOSIS — Z01812 Encounter for preprocedural laboratory examination: Secondary | ICD-10-CM | POA: Insufficient documentation

## 2014-04-10 HISTORY — DX: Nausea with vomiting, unspecified: Z98.890

## 2014-04-10 HISTORY — DX: Other chronic pain: G89.29

## 2014-04-10 HISTORY — DX: Reserved for concepts with insufficient information to code with codable children: IMO0002

## 2014-04-10 HISTORY — DX: Nausea with vomiting, unspecified: R11.2

## 2014-04-10 HISTORY — DX: Adverse effect of unspecified anesthetic, initial encounter: T41.45XA

## 2014-04-10 HISTORY — DX: Gastro-esophageal reflux disease without esophagitis: K21.9

## 2014-04-10 HISTORY — DX: Dorsalgia, unspecified: M54.9

## 2014-04-10 HISTORY — DX: Other complications of anesthesia, initial encounter: T88.59XA

## 2014-04-10 LAB — CBC WITH DIFFERENTIAL/PLATELET
BASOS ABS: 0 10*3/uL (ref 0.0–0.1)
Basophils Relative: 1 % (ref 0–1)
Eosinophils Absolute: 0.4 10*3/uL (ref 0.0–0.7)
Eosinophils Relative: 5 % (ref 0–5)
HCT: 43.8 % (ref 36.0–46.0)
Hemoglobin: 15.3 g/dL — ABNORMAL HIGH (ref 12.0–15.0)
LYMPHS ABS: 1.9 10*3/uL (ref 0.7–4.0)
Lymphocytes Relative: 24 % (ref 12–46)
MCH: 29.9 pg (ref 26.0–34.0)
MCHC: 34.9 g/dL (ref 30.0–36.0)
MCV: 85.7 fL (ref 78.0–100.0)
Monocytes Absolute: 0.4 10*3/uL (ref 0.1–1.0)
Monocytes Relative: 5 % (ref 3–12)
NEUTROS ABS: 5 10*3/uL (ref 1.7–7.7)
NEUTROS PCT: 65 % (ref 43–77)
PLATELETS: 240 10*3/uL (ref 150–400)
RBC: 5.11 MIL/uL (ref 3.87–5.11)
RDW: 12.9 % (ref 11.5–15.5)
WBC: 7.7 10*3/uL (ref 4.0–10.5)

## 2014-04-10 LAB — HEPATIC FUNCTION PANEL
ALK PHOS: 80 U/L (ref 39–117)
ALT: 12 U/L (ref 0–35)
AST: 14 U/L (ref 0–37)
Albumin: 4.5 g/dL (ref 3.5–5.2)
TOTAL PROTEIN: 7.7 g/dL (ref 6.0–8.3)
Total Bilirubin: 0.5 mg/dL (ref 0.3–1.2)

## 2014-04-10 LAB — BASIC METABOLIC PANEL
BUN: 9 mg/dL (ref 6–23)
CO2: 28 mEq/L (ref 19–32)
Calcium: 9.9 mg/dL (ref 8.4–10.5)
Chloride: 101 mEq/L (ref 96–112)
Creatinine, Ser: 0.95 mg/dL (ref 0.50–1.10)
GFR calc non Af Amer: 79 mL/min — ABNORMAL LOW (ref >90)
Glucose, Bld: 89 mg/dL (ref 70–99)
Potassium: 4.3 mEq/L (ref 3.7–5.3)
Sodium: 141 mEq/L (ref 137–147)

## 2014-04-10 LAB — HCG, SERUM, QUALITATIVE: Preg, Serum: NEGATIVE

## 2014-04-10 NOTE — Patient Instructions (Signed)
Sedonia SmallMiranda A Bartelson  04/10/2014   Your procedure is scheduled on:   04/14/2014  [facility] at  730 AM.  Call this number if you have problems the morning of surgery: [phone]   Remember:   Do not eat food or drink liquids after midnight.   Take these medicines the morning of surgery with A SIP OF WATER:  Norco, zofran   Do not wear jewelry, make-up or nail polish.  Do not wear lotions, powders, or perfumes.  Do not shave 48 hours prior to surgery. Men may shave face and neck.  Do not bring valuables to the hospital.  [facility] is not responsible for any belongings or valuables.               Contacts, dentures or bridgework may not be worn into surgery.  Leave suitcase in the car. After surgery it may be brought to your room.  For patients admitted to the hospital, discharge time is determined by your  treatment team.               Patients discharged the day of surgery will not be allowed to drive home.  Name and phone number of your driver: family  Special Instructions: Shower using CHG 2 nights before surgery and the night before surgery.  If you shower the day of surgery use CHG.  Use special wash - you have one bottle of CHG for all showers.  You should use approximately 1/3 of the bottle for each shower.   Please read over the following fact sheets that you were given: Pain Booklet, Coughing and Deep Breathing, Surgical Site Infection Prevention, Anesthesia Post-op Instructions and Care and Recovery After Surgery Laparoscopic Cholecystectomy Laparoscopic cholecystectomy is surgery to remove the gallbladder. The gallbladder is located in the upper right part of the abdomen, behind the liver. It is a storage sac for bile produced in the liver. Bile aids in the digestion and absorption of fats. Cholecystectomy is often done for inflammation of the gallbladder (cholecystitis). This condition is usually caused by a buildup of gallstones (cholelithiasis) in your  gallbladder. Gallstones can block the flow of bile, resulting in inflammation and pain. In severe cases, emergency surgery may be required. When emergency surgery is not required, you will have time to prepare for the procedure. Laparoscopic surgery is an alternative to open surgery. Laparoscopic surgery has a shorter recovery time. Your common bile duct may also need to be examined during the procedure. If stones are found in the common bile duct, they may be removed. LET [facility] KNOW ABOUT:  Any allergies you have.  All medicines you are taking, including vitamins, herbs, eye drops, creams, and over-the-counter medicines.  Previous problems you or members of your family have had with the use of anesthetics.  Any blood disorders you have.  Previous surgeries you have had.  Medical conditions you have. RISKS AND COMPLICATIONS Generally, this is a safe procedure. However, as with any procedure, complications can occur. Possible complications include:  Infection.  Damage to the common bile duct, nerves, arteries, veins, or other internal organs such as the stomach, liver, or intestines.  Bleeding.  A stone may remain in the common bile duct.  A bile leak from the cyst duct that is clipped when your gallbladder is removed.  The need to convert to open surgery, which requires a larger incision in the abdomen. This may be necessary if your surgeon thinks it  is not safe to continue with a laparoscopic procedure. BEFORE THE PROCEDURE  Ask your health care provider about changing or stopping any regular medicines. You will need to stop taking aspirin or blood thinners at least 5 days prior to surgery.  Do not eat or drink anything after midnight the night before surgery.  Let your health care provider know if you develop a cold or other infectious problem before surgery. PROCEDURE   You will be given medicine to make you sleep through the procedure (general  anesthetic). A breathing tube will be placed in your mouth.  When you are asleep, your surgeon will make several small cuts (incisions) in your abdomen.  A thin, lighted tube with a tiny camera on the end (laparoscope) is inserted through one of the small incisions. The camera on the laparoscope sends a picture to a TV screen in the operating room. This gives the surgeon a good view inside your abdomen.  A gas will be pumped into your abdomen. This expands your abdomen so that the surgeon has more room to perform the surgery.  Other tools needed for the procedure are inserted through the other incisions. The gallbladder is removed through one of the incisions.  After the removal of your gallbladder, the incisions will be closed with stitches, staples, or skin glue. AFTER THE PROCEDURE  You will be taken to a recovery area where your progress will be checked often.  You may be allowed to go home the same day if your pain is controlled and you can tolerate liquids. Document Released: 12/05/2005 Document Revised: 09/25/2013 Document Reviewed: 07/17/2013 [facility] Patient Information 2014 [place]. PATIENT INSTRUCTIONS POST-ANESTHESIA  IMMEDIATELY FOLLOWING SURGERY:  Do not drive or operate machinery for the first twenty four hours after surgery.  Do not make any important decisions for twenty four hours after surgery or while taking narcotic pain medications or sedatives.  If you develop intractable nausea and vomiting or a severe headache please notify your doctor immediately.  FOLLOW-UP:  Please make an appointment with your surgeon as instructed. You do not need to follow up with anesthesia unless specifically instructed to do so.  WOUND CARE INSTRUCTIONS (if applicable):  Keep a dry clean dressing on the anesthesia/puncture wound site if there is drainage.  Once the wound has quit draining you may leave it open to air.  Generally you should leave the bandage intact for twenty four  hours unless there is drainage.  If the epidural site drains for more than 36-48 hours please call the anesthesia department.  QUESTIONS?:  Please feel free to call your physician or the hospital operator if you have any questions, and they will be happy to assist you.

## 2014-04-10 NOTE — Pre-Procedure Instructions (Signed)
Given information to sign up for my chart at home. 

## 2014-04-11 NOTE — H&P (Signed)
  NTS SOAP Note  Vital Signs:  Vitals as of: 04/10/2014: Systolic 123: Diastolic 79: Heart Rate 89: Temp 97.85F: Height 855ft 2in: Weight 162Lbs 0 Ounces: Pain Level 10: BMI 29.63  BMI : 29.63 kg/m2  Subjective: This 89 Years 310 Months old Female presents for of upper abdominal pain.  Has been having intermittent epigastric and right upper quadrant abdominal pain and nausea.  Is worsening.  Workup thus far only shows chronic cholecystitis.  HIDA scan revealed low gallbladder ejection fraction with reproducible symptoms with CCK injection.  U/S of gallbladder negative.  Review of Symptoms:  Constitutional:unremarkable   Head:unremarkable    Eyes:unremarkable   Nose/Mouth/Throat:unremarkable Cardiovascular:  unremarkable   Respiratory:  wheezing Gastrointestin    abdominal pain,nausea Genitourinary:unremarkable       joint, neck, and back pain Skin:unremarkable Hematolgic/Lymphatic:unremarkable     Allergic/Immunologic:unremarkable     Past Medical History:    Reviewed  Past Medical History  Surgical History: none Medical Problems: none Allergies: PCN Medications: reglan, zofran, hydrocodone   Social History:Reviewed  Social History  Preferred Language: English Race:  White Ethnicity: Not Hispanic / Latino Age: 33 Years 10 Months Marital Status:  S Alcohol: unknown   Smoking Status: Never smoker reviewed on 04/10/2014 Functional Status reviewed on 04/10/2014 ------------------------------------------------ Bathing: Normal Cooking: Normal Dressing: Normal Driving: Normal Eating: Normal Managing Meds: Normal Oral Care: Normal Shopping: Normal Toileting: Normal Transferring: Normal Walking: Normal Cognitive Status reviewed on 04/10/2014 ------------------------------------------------ Attention: Normal Decision Making: Normal Language: Normal Memory: Normal Motor: Normal Perception: Normal Problem Solving:  Normal Visual and Spatial: Normal   Family History:  Reviewed  Family Health History Mother, Living; Healthy;  Father, Living; Healthy;     Objective Information: General:  Well appearing, well nourished in no distress.   no scleral icterus Heart:  RRR, no murmur or gallop.  Normal S1, S2.  No S3, S4.  Lungs:    CTA bilaterally, no wheezes, rhonchi, rales.  Breathing unlabored. Abdomen:Soft, tender in right upper quadrant to palpation, ND, normal bowel sounds, no HSM, no masses.  No peritoneal signs.  Assessment:Chronic cholecystitis  Diagnoses: 575.11 Chronic cholecystitis (Chronic cholecystitis)  Procedures: 1610999203 - OFFICE OUTPATIENT NEW 30 MINUTES    Plan:  Scheduled for laparoscopic cholecystectomy on 04/16/14.   Patient Education:Alternative treatments to surgery were discussed with patient (and family).  Risks and benefits  of procedure including bleeding, infection, hepatobiliary injury, and the possibility of an open procedure were fully explained to the patient (and family) who gave informed consent. Patient/family questions were addressed.  Follow-up:Pending Surgery

## 2014-04-14 ENCOUNTER — Encounter (HOSPITAL_COMMUNITY): Payer: No Typology Code available for payment source | Admitting: Anesthesiology

## 2014-04-14 ENCOUNTER — Encounter (HOSPITAL_COMMUNITY)
Admission: RE | Disposition: A | Discharge status: Home / Self Care | Payer: Self-pay | Source: Ambulatory Visit | Attending: General Surgery

## 2014-04-14 ENCOUNTER — Ambulatory Visit (HOSPITAL_COMMUNITY)
Admission: RE | Admit: 2014-04-14 | Discharge: 2014-04-14 | Disposition: A | Discharge status: Home / Self Care | Payer: No Typology Code available for payment source | Source: Ambulatory Visit | Attending: General Surgery | Admitting: General Surgery

## 2014-04-14 ENCOUNTER — Ambulatory Visit (HOSPITAL_COMMUNITY): Payer: No Typology Code available for payment source | Admitting: Anesthesiology

## 2014-04-14 ENCOUNTER — Encounter (HOSPITAL_COMMUNITY): Payer: Self-pay | Admitting: *Deleted

## 2014-04-14 DIAGNOSIS — K219 Gastro-esophageal reflux disease without esophagitis: Secondary | ICD-10-CM | POA: Insufficient documentation

## 2014-04-14 DIAGNOSIS — Z79899 Other long term (current) drug therapy: Secondary | ICD-10-CM | POA: Insufficient documentation

## 2014-04-14 DIAGNOSIS — I10 Essential (primary) hypertension: Secondary | ICD-10-CM | POA: Insufficient documentation

## 2014-04-14 DIAGNOSIS — Z87891 Personal history of nicotine dependence: Secondary | ICD-10-CM | POA: Insufficient documentation

## 2014-04-14 DIAGNOSIS — Z01810 Encounter for preprocedural cardiovascular examination: Secondary | ICD-10-CM | POA: Insufficient documentation

## 2014-04-14 DIAGNOSIS — K811 Chronic cholecystitis: Secondary | ICD-10-CM | POA: Insufficient documentation

## 2014-04-14 HISTORY — PX: CHOLECYSTECTOMY: SHX55

## 2014-04-14 SURGERY — LAPAROSCOPIC CHOLECYSTECTOMY
Anesthesia: General | Site: Abdomen

## 2014-04-14 MED ORDER — GLYCOPYRROLATE 0.2 MG/ML IJ SOLN
INTRAMUSCULAR | Status: AC
  Filled 2014-04-14: qty 1

## 2014-04-14 MED ORDER — ALBUTEROL SULFATE (2.5 MG/3ML) 0.083% IN NEBU
INHALATION_SOLUTION | RESPIRATORY_TRACT | Status: AC
  Filled 2014-04-14: qty 3

## 2014-04-14 MED ORDER — POVIDONE-IODINE 10 % OINT PACKET
TOPICAL_OINTMENT | CUTANEOUS | Status: DC | PRN
  Administered 2014-04-14: 1 via TOPICAL

## 2014-04-14 MED ORDER — SODIUM CHLORIDE BACTERIOSTATIC 0.9 % IJ SOLN
INTRAMUSCULAR | Status: AC
  Filled 2014-04-14: qty 20

## 2014-04-14 MED ORDER — LIDOCAINE HCL (PF) 1 % IJ SOLN
INTRAMUSCULAR | Status: AC
  Filled 2014-04-14: qty 5

## 2014-04-14 MED ORDER — ROCURONIUM BROMIDE 50 MG/5ML IV SOLN
INTRAVENOUS | Status: AC
  Filled 2014-04-14: qty 1

## 2014-04-14 MED ORDER — GLYCOPYRROLATE 0.2 MG/ML IJ SOLN
INTRAMUSCULAR | Status: AC
  Filled 2014-04-14: qty 3

## 2014-04-14 MED ORDER — LIDOCAINE HCL (CARDIAC) 20 MG/ML IV SOLN
INTRAVENOUS | Status: DC | PRN
  Administered 2014-04-14: 50 mg via INTRAVENOUS

## 2014-04-14 MED ORDER — SODIUM CHLORIDE 0.9 % IR SOLN
Status: DC | PRN
  Administered 2014-04-14: 1000 mL

## 2014-04-14 MED ORDER — EPHEDRINE SULFATE 50 MG/ML IJ SOLN
INTRAMUSCULAR | Status: AC
  Filled 2014-04-14: qty 1

## 2014-04-14 MED ORDER — POVIDONE-IODINE 10 % EX OINT
TOPICAL_OINTMENT | CUTANEOUS | Status: AC
  Filled 2014-04-14: qty 1

## 2014-04-14 MED ORDER — PHENYLEPHRINE HCL 10 MG/ML IJ SOLN
INTRAMUSCULAR | Status: AC
  Filled 2014-04-14: qty 1

## 2014-04-14 MED ORDER — CHLORHEXIDINE GLUCONATE 4 % EX LIQD: 1.0000 "application " | Freq: Once | CUTANEOUS | Status: AC

## 2014-04-14 MED ORDER — FENTANYL CITRATE 0.05 MG/ML IJ SOLN
INTRAMUSCULAR | Status: DC | PRN
  Administered 2014-04-14: 50 ug via INTRAVENOUS
  Administered 2014-04-14: 100 ug via INTRAVENOUS
  Administered 2014-04-14: 50 ug via INTRAVENOUS
  Administered 2014-04-14: 100 ug via INTRAVENOUS

## 2014-04-14 MED ORDER — PROPOFOL 10 MG/ML IV BOLUS
INTRAVENOUS | Status: AC
  Filled 2014-04-14: qty 20

## 2014-04-14 MED ORDER — ONDANSETRON HCL 4 MG/2ML IJ SOLN
INTRAMUSCULAR | Status: AC
  Filled 2014-04-14: qty 2

## 2014-04-14 MED ORDER — CIPROFLOXACIN IN D5W 400 MG/200ML IV SOLN
400.0000 mg | INTRAVENOUS | Status: AC
  Administered 2014-04-14: 400 mg via INTRAVENOUS
  Filled 2014-04-14: qty 200

## 2014-04-14 MED ORDER — LACTATED RINGERS IV SOLN
INTRAVENOUS | Status: DC | PRN
  Administered 2014-04-14 (×2): via INTRAVENOUS

## 2014-04-14 MED ORDER — LACTATED RINGERS IV SOLN
INTRAVENOUS | Status: DC
  Administered 2014-04-14: 09:00:00 via INTRAVENOUS

## 2014-04-14 MED ORDER — ONDANSETRON HCL 4 MG/2ML IJ SOLN
4.0000 mg | Freq: Once | INTRAMUSCULAR | Status: AC | PRN
  Administered 2014-04-14: 4 mg via INTRAVENOUS
  Filled 2014-04-14: qty 2

## 2014-04-14 MED ORDER — NEOSTIGMINE METHYLSULFATE 1 MG/ML IJ SOLN
INTRAMUSCULAR | Status: DC | PRN
  Administered 2014-04-14: 4 mg via INTRAVENOUS

## 2014-04-14 MED ORDER — GLYCOPYRROLATE 0.2 MG/ML IJ SOLN
INTRAMUSCULAR | Status: DC | PRN
  Administered 2014-04-14: 0.6 mg via INTRAVENOUS

## 2014-04-14 MED ORDER — SODIUM CHLORIDE BACTERIOSTATIC 0.9 % IJ SOLN
INTRAMUSCULAR | Status: AC
  Filled 2014-04-14: qty 10

## 2014-04-14 MED ORDER — MIDAZOLAM HCL 2 MG/2ML IJ SOLN
INTRAMUSCULAR | Status: AC
  Filled 2014-04-14: qty 2

## 2014-04-14 MED ORDER — SUCCINYLCHOLINE CHLORIDE 20 MG/ML IJ SOLN
INTRAMUSCULAR | Status: DC | PRN
  Administered 2014-04-14: 5 mg via INTRAVENOUS
  Administered 2014-04-14: 100 mg via INTRAVENOUS

## 2014-04-14 MED ORDER — GLYCOPYRROLATE 0.2 MG/ML IJ SOLN
0.2000 mg | Freq: Once | INTRAMUSCULAR | Status: AC
  Administered 2014-04-14: 0.2 mg via INTRAVENOUS

## 2014-04-14 MED ORDER — ARTIFICIAL TEARS OP OINT
TOPICAL_OINTMENT | OPHTHALMIC | Status: DC | PRN
  Administered 2014-04-14: 1 via OPHTHALMIC

## 2014-04-14 MED ORDER — ROCURONIUM BROMIDE 100 MG/10ML IV SOLN
INTRAVENOUS | Status: DC | PRN
  Administered 2014-04-14: 5 mg via INTRAVENOUS
  Administered 2014-04-14: 20 mg via INTRAVENOUS
  Administered 2014-04-14: 10 mg via INTRAVENOUS

## 2014-04-14 MED ORDER — FENTANYL CITRATE 0.05 MG/ML IJ SOLN
INTRAMUSCULAR | Status: AC
  Filled 2014-04-14: qty 2

## 2014-04-14 MED ORDER — BUPIVACAINE HCL (PF) 0.5 % IJ SOLN
INTRAMUSCULAR | Status: DC | PRN
Start: 2014-04-14 — End: 2014-04-14
  Administered 2014-04-14: 10 mL

## 2014-04-14 MED ORDER — FENTANYL CITRATE 0.05 MG/ML IJ SOLN
25.0000 ug | INTRAMUSCULAR | Status: DC | PRN
  Administered 2014-04-14 (×4): 50 ug via INTRAVENOUS
  Filled 2014-04-14: qty 2

## 2014-04-14 MED ORDER — FENTANYL CITRATE 0.05 MG/ML IJ SOLN
INTRAMUSCULAR | Status: AC
  Filled 2014-04-14: qty 5

## 2014-04-14 MED ORDER — MIDAZOLAM HCL 2 MG/2ML IJ SOLN
1.0000 mg | INTRAMUSCULAR | Status: DC | PRN
  Administered 2014-04-14 (×2): 2 mg via INTRAVENOUS
  Filled 2014-04-14: qty 2

## 2014-04-14 MED ORDER — DEXAMETHASONE SODIUM PHOSPHATE 4 MG/ML IJ SOLN
4.0000 mg | Freq: Once | INTRAMUSCULAR | Status: AC
  Administered 2014-04-14: 4 mg via INTRAVENOUS
  Filled 2014-04-14: qty 1

## 2014-04-14 MED ORDER — SODIUM CHLORIDE 0.9 % IN NEBU
INHALATION_SOLUTION | RESPIRATORY_TRACT | Status: AC
  Filled 2014-04-14: qty 3

## 2014-04-14 MED ORDER — ONDANSETRON HCL 4 MG/2ML IJ SOLN
4.0000 mg | Freq: Once | INTRAMUSCULAR | Status: AC
  Administered 2014-04-14: 4 mg via INTRAVENOUS

## 2014-04-14 MED ORDER — PROPOFOL 10 MG/ML IV BOLUS
INTRAVENOUS | Status: DC | PRN
  Administered 2014-04-14: 150 mg via INTRAVENOUS

## 2014-04-14 MED ORDER — OXYCODONE-ACETAMINOPHEN 7.5-325 MG PO TABS: 1.0000 | ORAL_TABLET | ORAL | Status: DC | PRN

## 2014-04-14 MED ORDER — KETOROLAC TROMETHAMINE 30 MG/ML IJ SOLN
30.0000 mg | Freq: Once | INTRAMUSCULAR | Status: AC
  Administered 2014-04-14: 30 mg via INTRAVENOUS
  Filled 2014-04-14: qty 1

## 2014-04-14 MED ORDER — BUPIVACAINE HCL (PF) 0.5 % IJ SOLN
INTRAMUSCULAR | Status: AC
  Filled 2014-04-14: qty 30

## 2014-04-14 MED ORDER — ALBUTEROL SULFATE (2.5 MG/3ML) 0.083% IN NEBU
2.5000 mg | INHALATION_SOLUTION | Freq: Once | RESPIRATORY_TRACT | Status: AC
  Administered 2014-04-14: 2.5 mg via RESPIRATORY_TRACT

## 2014-04-14 MED ORDER — HEMOSTATIC AGENTS (NO CHARGE) OPTIME
TOPICAL | Status: DC | PRN
  Administered 2014-04-14: 1 via TOPICAL

## 2014-04-14 SURGICAL SUPPLY — 47 items
APPLIER CLIP LAPSCP 10X32 DD (CLIP) ×3 IMPLANT
BAG HAMPER (MISCELLANEOUS) ×3 IMPLANT
BAG SPEC RTRVL LRG 6X4 10 (ENDOMECHANICALS) ×1
CLOTH BEACON ORANGE TIMEOUT ST (SAFETY) ×3 IMPLANT
COVER LIGHT HANDLE STERIS (MISCELLANEOUS) ×6 IMPLANT
DECANTER SPIKE VIAL GLASS SM (MISCELLANEOUS) ×3 IMPLANT
DURAPREP 26ML APPLICATOR (WOUND CARE) ×3 IMPLANT
ELECT REM PT RETURN 9FT ADLT (ELECTROSURGICAL) ×3
ELECTRODE REM PT RTRN 9FT ADLT (ELECTROSURGICAL) ×1 IMPLANT
FILTER SMOKE EVAC LAPAROSHD (FILTER) ×3 IMPLANT
FORMALIN 10 PREFIL 120ML (MISCELLANEOUS) ×3 IMPLANT
GLOVE BIO SURGEON STRL SZ7.5 (GLOVE) ×3 IMPLANT
GLOVE BIOGEL PI IND STRL 7.0 (GLOVE) IMPLANT
GLOVE BIOGEL PI IND STRL 7.5 (GLOVE) IMPLANT
GLOVE BIOGEL PI IND STRL 8 (GLOVE) ×1 IMPLANT
GLOVE BIOGEL PI INDICATOR 7.0 (GLOVE) ×2
GLOVE BIOGEL PI INDICATOR 7.5 (GLOVE) ×4
GLOVE BIOGEL PI INDICATOR 8 (GLOVE) ×2
GLOVE ECLIPSE 7.0 STRL STRAW (GLOVE) ×2 IMPLANT
GLOVE SS BIOGEL STRL SZ 6.5 (GLOVE) IMPLANT
GLOVE SUPERSENSE BIOGEL SZ 6.5 (GLOVE) ×2
GOWN STRL REUS W/TWL LRG LVL3 (GOWN DISPOSABLE) ×9 IMPLANT
HEMOSTAT SNOW SURGICEL 2X4 (HEMOSTASIS) ×3 IMPLANT
INST SET LAPROSCOPIC AP (KITS) ×3 IMPLANT
IV NS IRRIG 3000ML ARTHROMATIC (IV SOLUTION) IMPLANT
KIT ROOM TURNOVER APOR (KITS) ×3 IMPLANT
MANIFOLD NEPTUNE II (INSTRUMENTS) ×3 IMPLANT
NDL INSUFFLATION 14GA 120MM (NEEDLE) ×1 IMPLANT
NEEDLE INSUFFLATION 14GA 120MM (NEEDLE) ×3 IMPLANT
NS IRRIG 1000ML POUR BTL (IV SOLUTION) ×3 IMPLANT
PACK LAP CHOLE LZT030E (CUSTOM PROCEDURE TRAY) ×3 IMPLANT
PAD ARMBOARD 7.5X6 YLW CONV (MISCELLANEOUS) ×3 IMPLANT
POUCH SPECIMEN RETRIEVAL 10MM (ENDOMECHANICALS) ×3 IMPLANT
SET BASIN LINEN APH (SET/KITS/TRAYS/PACK) ×3 IMPLANT
SET TUBE IRRIG SUCTION NO TIP (IRRIGATION / IRRIGATOR) IMPLANT
SLEEVE ENDOPATH XCEL 5M (ENDOMECHANICALS) ×3 IMPLANT
SPONGE GAUZE 2X2 8PLY STER LF (GAUZE/BANDAGES/DRESSINGS) ×4
SPONGE GAUZE 2X2 8PLY STRL LF (GAUZE/BANDAGES/DRESSINGS) ×8 IMPLANT
STAPLER VISISTAT (STAPLE) ×3 IMPLANT
SUT VICRYL 0 UR6 27IN ABS (SUTURE) ×3 IMPLANT
TAPE CLOTH SURG 4X10 WHT LF (GAUZE/BANDAGES/DRESSINGS) ×2 IMPLANT
TROCAR ENDO BLADELESS 11MM (ENDOMECHANICALS) ×3 IMPLANT
TROCAR XCEL NON-BLD 5MMX100MML (ENDOMECHANICALS) ×3 IMPLANT
TROCAR XCEL UNIV SLVE 11M 100M (ENDOMECHANICALS) ×3 IMPLANT
TUBING INSUFFLATION (TUBING) ×3 IMPLANT
WARMER LAPAROSCOPE (MISCELLANEOUS) ×3 IMPLANT
YANKAUER SUCT 12FT TUBE ARGYLE (SUCTIONS) ×3 IMPLANT

## 2014-04-14 NOTE — Anesthesia Postprocedure Evaluation (Signed)
  Anesthesia Post-op Note  Patient: Kiara Dillon  Procedure(s) Performed: Procedure(s): LAPAROSCOPIC CHOLECYSTECTOMY (N/A)  Patient Location: PACU  Anesthesia Type:General  Level of Consciousness: sedated and patient cooperative  Airway and Oxygen Therapy: Patient Spontanous Breathing and Patient connected to face mask oxygen  Post-op Pain: mild  Post-op Assessment: Post-op Vital signs reviewed, Patient's Cardiovascular Status Stable, Respiratory Function Stable, Patent Airway, No signs of Nausea or vomiting and Pain level controlled  Post-op Vital Signs: Reviewed and stable  Last Vitals:  Filed Vitals:   04/14/14 1010  BP: 129/79  Pulse: 82  Temp:   Resp: 18    Complications: No apparent anesthesia complications

## 2014-04-14 NOTE — Anesthesia Procedure Notes (Signed)
Procedure Name: Intubation Date/Time: 04/14/2014 9:22 AM Performed by: Pernell DupreADAMS, Kiara Luckey A Pre-anesthesia Checklist: Patient identified, Patient being monitored, Timeout performed, Emergency Drugs available and Suction available Patient Re-evaluated:Patient Re-evaluated prior to inductionOxygen Delivery Method: Circle System Utilized Preoxygenation: Pre-oxygenation with 100% oxygen Intubation Type: IV induction, Rapid sequence and Cricoid Pressure applied Laryngoscope Size: 3 and Miller Grade View: Grade I Tube type: Oral Tube size: 7.0 mm Number of attempts: 1 Airway Equipment and Method: stylet Placement Confirmation: ETT inserted through vocal cords under direct vision,  positive ETCO2 and breath sounds checked- equal and bilateral Secured at: 21 cm Tube secured with: Tape Dental Injury: Teeth and Oropharynx as per pre-operative assessment

## 2014-04-14 NOTE — Transfer of Care (Signed)
Immediate Anesthesia Transfer of Care Note  Patient: Kiara Dillon  Procedure(s) Performed: Procedure(s): LAPAROSCOPIC CHOLECYSTECTOMY (N/A)  Patient Location: PACU  Anesthesia Type:General  Level of Consciousness: sedated and patient cooperative  Airway & Oxygen Therapy: Patient Spontanous Breathing and Patient connected to face mask oxygen  Post-op Assessment: Report given to PACU RN and Post -op Vital signs reviewed and stable  Post vital signs: Reviewed and stable  Complications: No apparent anesthesia complications

## 2014-04-14 NOTE — Discharge Instructions (Signed)
Laparoscopic Cholecystectomy, Care After °Refer to this sheet in the next few weeks. These instructions provide you with information on caring for yourself after your procedure. Your health care provider may also give you more specific instructions. Your treatment has been planned according to current medical practices, but problems sometimes occur. Call your health care provider if you have any problems or questions after your procedure. °WHAT TO EXPECT AFTER THE PROCEDURE °After your procedure, it is typical to have the following: °· Pain at your incision sites. You will be given pain medicines to control the pain. °· Mild nausea or vomiting. This should improve after the first 24 hours. °· Bloating and possibly shoulder pain from the gas used during the procedure. This will improve after the first 24 hours. °HOME CARE INSTRUCTIONS  °· Change bandages (dressings) as directed by your health care provider. °· Keep the wound dry and clean. You may wash the wound gently with soap and water. Gently blot or dab the area dry. °· Do not take baths or use swimming pools or hot tubs for 2 weeks or until your health care provider approves. °· Only take over-the-counter or prescription medicines as directed by your health care provider. °· Continue your normal diet as directed by your health care provider. °· Do not lift anything heavier than 10 pounds (4.5 kg) until your health care provider approves. °· Do not play contact sports for 1 week or until your health care provider approves. °SEEK MEDICAL CARE IF:  °· You have redness, swelling, or increasing pain in the wound. °· You notice yellowish-white fluid (pus) coming from the wound. °· You have drainage from the wound that lasts longer than 1 day. °· You notice a bad smell coming from the wound or dressing. °· Your surgical cuts (incisions) break open. °SEEK IMMEDIATE MEDICAL CARE IF:  °· You develop a rash. °· You have difficulty breathing. °· You have chest pain. °· You  have a fever. °· You have increasing pain in the shoulders (shoulder strap areas). °· You have dizzy episodes or faint while standing. °· You have severe abdominal pain. °· You feel sick to your stomach (nauseous) or throw up (vomit) and this lasts for more than 1 day. °Document Released: 12/05/2005 Document Revised: 09/25/2013 Document Reviewed: 07/17/2013 °[employer]® Patient Information ©2014 [employer]. ° °

## 2014-04-14 NOTE — Interval H&P Note (Signed)
History and Physical Interval Note:  04/14/2014 8:41 AM  Kiara Dillon  has presented today for surgery, with the diagnosis of chronic cholecystitis  The various methods of treatment have been discussed with the patient and family. After consideration of risks, benefits and other options for treatment, the patient has consented to  Procedure(s): LAPAROSCOPIC CHOLECYSTECTOMY (N/A) as a surgical intervention .  The patient's history has been reviewed, patient examined, no change in status, stable for surgery.  I have reviewed the patient's chart and labs.  Questions were answered to the patient's satisfaction.     Dalia HeadingMark A Jeany Seville

## 2014-04-14 NOTE — Anesthesia Preprocedure Evaluation (Signed)
Anesthesia Evaluation  Patient identified by MRN, date of birth, ID band Patient awake    Reviewed: Allergy & Precautions, H&P , NPO status , Patient's Chart, lab work & pertinent test results  History of Anesthesia Complications (+) PONV and history of anesthetic complications  Airway Mallampati: II TM Distance: >3 FB Neck ROM: Full    Dental  (+) Teeth Intact   Pulmonary asthma , former smoker,  breath sounds clear to auscultation        Cardiovascular hypertension, Pt. on medications Rhythm:Regular Rate:Normal     Neuro/Psych    GI/Hepatic GERD-  Controlled and Medicated,  Endo/Other    Renal/GU      Musculoskeletal   Abdominal   Peds  Hematology   Anesthesia Other Findings   Reproductive/Obstetrics                           Anesthesia Physical Anesthesia Plan  ASA: II  Anesthesia Plan: General   Post-op Pain Management:    Induction: Intravenous, Rapid sequence and Cricoid pressure planned  Airway Management Planned: Oral ETT  Additional Equipment:   Intra-op Plan:   Post-operative Plan: Extubation in OR  Informed Consent: I have reviewed the patients History and Physical, chart, labs and discussed the procedure including the risks, benefits and alternatives for the proposed anesthesia with the patient or authorized representative who has indicated his/her understanding and acceptance.     Plan Discussed with:   Anesthesia Plan Comments: (Albuterol pre-op)        Anesthesia Quick Evaluation

## 2014-04-14 NOTE — Op Note (Signed)
Patient:  Kiara Dillon  DOB:  September 20, 1981  MRN:  [id number]   Preop Diagnosis:  Chronic cholecystitis  Postop Diagnosis:  Same  Procedure:  Laparoscopic cholecystectomy  Surgeon:  Franky MachoMark Lydon Vansickle, M.D.  Anes:  General endotracheal  Indications:  Patient is a 33 year old white female who presents with chronic cholecystitis. The risks and benefits of the procedure including bleeding, infection, hepatobiliary injury, and the possibility of an open procedure were fully explained to the patient, who gave informed consent.  Procedure note:  The patient was placed the supine position. After induction of general endotracheal anesthesia, the abdomen was prepped and draped using usual sterile technique with DuraPrep. Surgical site confirmation was performed.  An infraumbilical incision was made down to the fascia. A Veress needle was introduced into the abdominal cavity and confirmation of placement was done using the saline drop test. The abdomen was then insufflated to 16 mm mercury pressure. An 11 mm trocar was introduced into the abdominal cavity under direct visualization without difficulty. The patient was placed in reverse Trendelenburg position and additional 11 mm trocar was placed the epigastric region and 5 mm trochars were placed the right upper quadrant and right flank regions. The liver was inspected and noted within normal limits. The gallbladder was retracted in a dynamic fashion in order to expose the triangle of Calot.  The cystic duct was first identified. Its juncture to the infundibulum was fully identified. Endoclips were placed proximally and distally on the cystic duct, and the cystic duct was divided. This was likewise done the cystic artery. The gallbladder was then freed away from the gallbladder fossa using Bovie electrocautery. The gallbladder was delivered through the epigastric trocar site using an Endo Catch bag. The gallbladder fossa was inspected and no abnormal bleeding or  bile leakage was noted. Surgicel is placed the gallbladder fossa. All fluid and air were then evacuated from the abdominal cavity prior to removal of the trochars.  All wounds were irrigated with normal saline. All wounds were injected with 0.5% Sensorcaine. The infraumbilical fascia was reapproximated using an 0 Vicryl interrupted suture. All skin incisions were closed using staples. Betadine ointment and dry sterile dressings were applied.  All tape and needle counts were correct at the end of the procedure. Patient was extubated in the operating room and transferred to PACU in stable condition.  Complications:  None  EBL:  Minimal  Specimen:  Gallbladder

## 2014-04-16 ENCOUNTER — Encounter (HOSPITAL_COMMUNITY): Payer: Self-pay | Admitting: General Surgery

## 2014-05-08 ENCOUNTER — Telehealth: Payer: Self-pay

## 2014-05-08 ENCOUNTER — Other Ambulatory Visit: Payer: Self-pay

## 2014-05-08 DIAGNOSIS — R1013 Epigastric pain: Secondary | ICD-10-CM

## 2014-05-08 LAB — CBC WITH DIFFERENTIAL/PLATELET
BASOS ABS: 0 10*3/uL (ref 0.0–0.1)
BASOS PCT: 0 % (ref 0–1)
EOS PCT: 3 % (ref 0–5)
Eosinophils Absolute: 0.3 10*3/uL (ref 0.0–0.7)
HEMATOCRIT: 41.8 % (ref 36.0–46.0)
Hemoglobin: 14.5 g/dL (ref 12.0–15.0)
Lymphocytes Relative: 30 % (ref 12–46)
Lymphs Abs: 2.6 10*3/uL (ref 0.7–4.0)
MCH: 29.4 pg (ref 26.0–34.0)
MCHC: 34.7 g/dL (ref 30.0–36.0)
MCV: 84.8 fL (ref 78.0–100.0)
MONO ABS: 0.4 10*3/uL (ref 0.1–1.0)
Monocytes Relative: 5 % (ref 3–12)
Neutro Abs: 5.3 10*3/uL (ref 1.7–7.7)
Neutrophils Relative %: 62 % (ref 43–77)
Platelets: 239 10*3/uL (ref 150–400)
RBC: 4.93 MIL/uL (ref 3.87–5.11)
RDW: 13.6 % (ref 11.5–15.5)
WBC: 8.6 10*3/uL (ref 4.0–10.5)

## 2014-05-08 NOTE — Telephone Encounter (Signed)
Routing to Doris. This is an [facility] pt. 

## 2014-05-08 NOTE — Telephone Encounter (Signed)
I called pt. She said she had an episode this morning that lasted about 15 min with pain in her chest, between her breasts. She described it as hurting so bad she could hardly stand it. She laid down on the bed and started taking slow breaths and finally the pain decreased. She said she was having trouble breathing also.  She said she feels much better at this time.   She has had a lot of nausea but no vomiting.   I told her if the pain got worse again and was severe that she should go to the ED or call 911.  [facility]  I told her I would check with Dr. Darrick PennaFields and she said she has never seen Dr. Darrick PennaFields, that she sees Dr. Jena Gaussourk.  I told her I will send message to Tana CoastLeslie Lewis, PA, who last saw her.

## 2014-05-08 NOTE — Telephone Encounter (Signed)
Called pt. She will go to the lab and orders have been faxed.

## 2014-05-08 NOTE — Telephone Encounter (Signed)
Pt is calling because she had her gallbladder remover(04/14/14). Now she is having pain in her lungs. Like it is hard for her to breath at times. In the center of her chest and it is tight. She has not called her PCP. She has gastroptosis. Please advise. Her call back number [phone]

## 2014-05-08 NOTE — Telephone Encounter (Signed)
Lap Chole on 04/14/14 for chronic cholecystitis.  Prior EGD/TCS 02/2014 Dr. Elnoria HowardHung reviewed. Retained gastric contents but otherwise unremarkable.  Recommend CBC, LFTs, lipase now.  Add PPI, prilosec otc daily or any other PPI she has on hand. If recurrent chest pain/SOB, would recommend going to ER.

## 2014-05-09 LAB — LIPASE: LIPASE: 18 U/L (ref 0–75)

## 2014-05-09 LAB — HEPATIC FUNCTION PANEL
ALBUMIN: 5.1 g/dL (ref 3.5–5.2)
ALT: 10 U/L (ref 0–35)
AST: 12 U/L (ref 0–37)
Alkaline Phosphatase: 71 U/L (ref 39–117)
Bilirubin, Direct: 0.1 mg/dL (ref 0.0–0.3)
Indirect Bilirubin: 0.3 mg/dL (ref 0.2–1.2)
Total Bilirubin: 0.4 mg/dL (ref 0.2–1.2)
Total Protein: 7.4 g/dL (ref 6.0–8.3)

## 2014-05-09 NOTE — Progress Notes (Signed)
Quick Note:  Please let patient know her labs were fine. Plan as outlined yesterday. See phone note. ______

## 2014-06-04 ENCOUNTER — Ambulatory Visit (INDEPENDENT_AMBULATORY_CARE_PROVIDER_SITE_OTHER): Payer: No Typology Code available for payment source | Admitting: Gastroenterology

## 2014-06-04 ENCOUNTER — Encounter (INDEPENDENT_AMBULATORY_CARE_PROVIDER_SITE_OTHER): Payer: Self-pay

## 2014-06-04 ENCOUNTER — Encounter: Payer: Self-pay | Admitting: Gastroenterology

## 2014-06-04 VITALS — BP 140/90 | HR 100 | Temp 99.3°F | Resp 18 | Ht 62.0 in | Wt 159.0 lb

## 2014-06-04 DIAGNOSIS — R142 Eructation: Secondary | ICD-10-CM

## 2014-06-04 DIAGNOSIS — R1013 Epigastric pain: Secondary | ICD-10-CM

## 2014-06-04 DIAGNOSIS — R140 Abdominal distension (gaseous): Secondary | ICD-10-CM

## 2014-06-04 DIAGNOSIS — R143 Flatulence: Secondary | ICD-10-CM

## 2014-06-04 DIAGNOSIS — R141 Gas pain: Secondary | ICD-10-CM

## 2014-06-04 MED ORDER — LINACLOTIDE 145 MCG PO CAPS: 145.0000 ug | ORAL_CAPSULE | Freq: Every day | ORAL | Status: DC

## 2014-06-04 MED ORDER — PROMETHAZINE HCL 25 MG PO TABS: 25.0000 mg | ORAL_TABLET | Freq: Four times a day (QID) | ORAL | Status: DC | PRN

## 2014-06-04 NOTE — Progress Notes (Signed)
Referring Provider: Elfredia NevinsFusco, Lawrence, MD Primary Care Physician:  Cassell SmilesFUSCO,LAWRENCE J., MD Primary GI: Dr. Jena Gaussourk   Chief Complaint  Patient presents with  . Follow-up  . Abdominal Pain  . Constipation    2 days since last bowel movement  . Diarrhea  . Bloated    HPI:   Kiara Dillon presents today in follow-up for chronic abdominal pain, gastroparesis, IBS. Has completed EGD, TCS, CT, and GES. Better since cholecystectomy. Not as much N/V. Not on Reglan. Liked Phenergan better than Zofran. Notes lower abdominal pain, radiates to upper abdomen. Feels bloated, swollen, causes shortness of breath. Doesn't have energy. No rectal bleeding. Pain after eating, upper abdomen. Some mixed constipation/diarrhea. No real improvement after bowel movement. Has to lay down because of severe bloating. Cramping/burning pain. Then eases off. Woke up this morning bloated. Feels like lots of pressure on ribs and stomach after laying down at night. Feels more constipated than the other. No BM in 2 days.   Past Medical History  Diagnosis Date  . Cholestasis of pregnancy   . 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 Asthma   . Complication of anesthesia   . PONV (postoperative nausea and vomiting)   . GERD (gastroesophageal reflux disease)   . Hypertension     sts hasnt had any trouble or been on meds in 2 years.  . Degenerative disc disease   . Chronic back pain     Past Surgical History  Procedure Laterality Date  . Wisdom tooth extraction    . No past surgeries    . Esophagogastroduodenoscopy  02/2014    Dr. Jeani HawkingPatrick Hung: (propofol) retained fluid/gastric contents found in gastric fundus and body. Fluid aspiration to clear gastric lumen. Patient developed asthmatic exacerbation during procedure and had to be woke up to receive nebulizer.  . Colonoscopy  02/2014    Dr. Luisa HartPatrick Hung:(propofol) normal  . Cholecystectomy N/A 04/14/2014    Procedure: LAPAROSCOPIC CHOLECYSTECTOMY;   Surgeon: Dalia HeadingMark A Jenkins, MD;  Location: [facility];  Service: General;  Laterality: N/A;  . Gallbladder surgery      Current Outpatient Prescriptions  Medication Sig Dispense Refill  . meloxicam (MOBIC) 7.5 MG tablet Take 7.5 mg by mouth daily.      . ondansetron (ZOFRAN) 4 MG tablet Take 1 tablet (4 mg total) by mouth every 8 (eight) hours as needed for nausea or vomiting.  30 tablet  1  . PROAIR HFA 108 (90 BASE) MCG/ACT inhaler Inhale 2 puffs into the lungs 2 (two) times daily as needed.      . traMADol (ULTRAM) 50 MG tablet Take 50 mg by mouth 4 (four) times daily.      . Linaclotide (LINZESS) 145 MCG CAPS capsule Take 1 capsule (145 mcg total) by mouth daily. 30 minutes before breakfast  30 capsule  5  . promethazine (PHENERGAN) 25 MG tablet Take 1 tablet (25 mg total) by mouth every 6 (six) hours as needed for nausea or vomiting.  30 tablet  1   No current facility-administered medications for this visit.    Allergies as of 06/04/2014 - Review Complete 06/04/2014  Allergen Reaction Noted  . Penicillins Other (See Comments) 09/29/2011    Family History  Problem Relation Age of Onset  . Arthritis Mother   . Asthma Mother   . Depression Mother   . Hypertension Mother   . Miscarriages / Stillbirths Maternal Aunt   . Stroke  Paternal Uncle   . Heart disease Paternal Uncle   . Cancer Maternal Grandfather     kidney  . Heart disease Paternal Grandmother     History   Social History  . Marital Status: Single    Spouse Name: N/A    Number of Children: 1  . Years of Education: N/A   Occupational History  .     Social History Main Topics  . Smoking status: Former Smoker -- 0.50 packs/day for 5 years    Types: Cigarettes    Quit date: 04/11/2011  . Smokeless tobacco: Never Used  . Alcohol Use: No  . Drug Use: No  . Sexual Activity: Yes    Birth Control/ Protection: None   Other Topics Concern  . None   Social History Narrative  . None    Review of Systems: As  mentioned in HPI  Physical Exam: BP 140/90  Pulse 100  Temp(Src) 99.3 F (37.4 C) (Oral)  Resp 18  Ht 5\' 2"  (1.575 m)  Wt 159 lb (72.122 kg)  BMI 29.07 kg/m2 General:   Alert and oriented. No distress noted. Pleasant and cooperative.  Abdomen:  +BS, soft, TTP diffusely but no peritoneal signs, rebound, or guarding. Extremities:  Without edema. Neurologic:  Alert and  oriented x4;  grossly normal neurologically. Skin:  Intact without significant lesions or rashes. Psych:  Alert and cooperative. Normal mood and affect.  Lab Results  Component Value Date   WBC 8.6 05/08/2014   HGB 14.5 05/08/2014   HCT 41.8 05/08/2014   MCV 84.8 05/08/2014   PLT 239 05/08/2014   Lab Results  Component Value Date   ALT 10 05/08/2014   AST 12 05/08/2014   ALKPHOS 71 05/08/2014   BILITOT 0.4 05/08/2014   Lab Results  Component Value Date   LIPASE 18 05/08/2014

## 2014-06-04 NOTE — Patient Instructions (Signed)
Please have blood work completed.   Start taking a probiotic daily such as [employer]age, [employer], [facility] brand.   For constipation: I have provided a voucher for Linzess. Take this 1 capsule each morning on an empty stomach.  We will see you in 6 weeks.

## 2014-06-08 ENCOUNTER — Telehealth: Payer: Self-pay | Admitting: Gastroenterology

## 2014-06-08 DIAGNOSIS — R140 Abdominal distension (gaseous): Secondary | ICD-10-CM | POA: Insufficient documentation

## 2014-06-08 NOTE — Assessment & Plan Note (Signed)
In setting of known mildly delayed gastric emptying, IBS. Likely multifactorial. EGD/TCS/CT on file. Status post cholecystectomy recently. No real improvement in overall symptoms. Check celiac serologies, H.pylori serology, provide Linzess 145 mcg daily, add probiotic.

## 2014-06-08 NOTE — Assessment & Plan Note (Signed)
With extensive work-up. Multifactorial. Treat IBS, check labs as indicated under "bloating".

## 2014-06-08 NOTE — Telephone Encounter (Signed)
I didn't realize patient is an [facility] patient. Please have her follow-up with in about 2 months instead of Dr. Darrick PennaFields. THanks!

## 2014-06-09 NOTE — Progress Notes (Signed)
cc'd to pcp 

## 2014-06-10 LAB — H. PYLORI ANTIBODY, IGG: H Pylori IgG: 0.46 {ISR}

## 2014-06-10 LAB — IGA: IgA: 183 mg/dL (ref 69–380)

## 2014-06-11 ENCOUNTER — Encounter: Payer: Self-pay | Admitting: Internal Medicine

## 2014-06-11 LAB — TISSUE TRANSGLUTAMINASE, IGA: Tissue Transglutaminase Ab, IgA: 3.6 U/mL (ref <20)

## 2014-06-11 NOTE — Telephone Encounter (Signed)
[facility]  Patient to see [facility] per [facility] on 7-27 at 330 pm and cancelled OV with SF

## 2014-06-11 NOTE — Progress Notes (Signed)
Quick Note:  Negative celiac serologies and H.pylori serology.  Continue Linzess as prescribed. ______

## 2014-06-13 ENCOUNTER — Telehealth: Payer: Self-pay

## 2014-06-13 NOTE — Telephone Encounter (Signed)
[facility] called- he received a call from the hospital operator that the patient had called- he tried to call her back and got her voicemail. I tried to call the pt back and all I got was the voicemail. I left a message for her to call me back.

## 2014-06-17 ENCOUNTER — Emergency Department (HOSPITAL_COMMUNITY)
Admission: EM | Admit: 2014-06-17 | Discharge: 2014-06-17 | Disposition: A | Discharge status: Home / Self Care | Payer: No Typology Code available for payment source | Attending: Emergency Medicine | Admitting: Emergency Medicine

## 2014-06-17 ENCOUNTER — Encounter (HOSPITAL_COMMUNITY): Payer: Self-pay | Admitting: Emergency Medicine

## 2014-06-17 DIAGNOSIS — Z87891 Personal history of nicotine dependence: Secondary | ICD-10-CM | POA: Insufficient documentation

## 2014-06-17 DIAGNOSIS — B354 Tinea corporis: Secondary | ICD-10-CM | POA: Insufficient documentation

## 2014-06-17 DIAGNOSIS — Z880 Allergy status to penicillin: Secondary | ICD-10-CM | POA: Insufficient documentation

## 2014-06-17 DIAGNOSIS — Z8719 Personal history of other diseases of the digestive system: Secondary | ICD-10-CM | POA: Insufficient documentation

## 2014-06-17 DIAGNOSIS — I10 Essential (primary) hypertension: Secondary | ICD-10-CM | POA: Insufficient documentation

## 2014-06-17 DIAGNOSIS — J45909 Unspecified asthma, uncomplicated: Secondary | ICD-10-CM | POA: Insufficient documentation

## 2014-06-17 DIAGNOSIS — Z8739 Personal history of other diseases of the musculoskeletal system and connective tissue: Secondary | ICD-10-CM | POA: Insufficient documentation

## 2014-06-17 DIAGNOSIS — R21 Rash and other nonspecific skin eruption: Secondary | ICD-10-CM | POA: Insufficient documentation

## 2014-06-17 DIAGNOSIS — Z9889 Other specified postprocedural states: Secondary | ICD-10-CM | POA: Insufficient documentation

## 2014-06-17 MED ORDER — PREDNISONE (PAK) 10 MG PO TABS: ORAL_TABLET | Freq: Every day | ORAL | Status: DC

## 2014-06-17 MED ORDER — CLOTRIMAZOLE 1 % EX CREA: TOPICAL_CREAM | CUTANEOUS | Status: DC

## 2014-06-17 MED ORDER — HYDROXYZINE HCL 25 MG PO TABS: 25.0000 mg | ORAL_TABLET | Freq: Four times a day (QID) | ORAL | Status: DC | PRN

## 2014-06-17 NOTE — ED Provider Notes (Signed)
CSN: [id number]     Arrival date & time 06/17/14  1319 History  This chart was scribed for non-physician practitioner, Trixie DredgeEmily Marin Wisner, PA-C, working with Ethelda ChickMartha K Linker, MD by Shari HeritageAisha Amuda, ED Scribe. This patient was seen in room TR05C/TR05C and the patient's care was started at 2:59 PM.   Chief Complaint  Patient presents with  . Rash    The history is provided by the patient. No language interpreter was used.    HPI Comments: [name] is a 33 y.o. female who presents to the Emergency Department complaining of an erythematous, pruritic, papular rash to her upper arms, thighs onset 4 days ago. Patient states that the first papule she noticed looked like a mosquito bite on her right upper arm, then rash spread to other parts of her body. Patient says that she thought rash looked like ringworm so she started using clotrimazole cream and Tolnaftate without relief. Patient works as a Teacher, adult educationmassage therapist and is concerned that her rash may be contagious. She states that she hasn't noticed any ticks on her body. She recently traveled and stayed in a hotel on Saturday, but this was after the rash began. She has not started any new soaps, detergents, lotions, detergents. She has not worn any new clothes or tried any new foods recently. She denies itching or swelling in her mouth or throat, fevers, body aches, shortness of breath, cough. No history of DM. She has a history of gastroparesis.   [place] [facility] [place]  Past Medical History  Diagnosis Date  . Cholestasis of pregnancy   . 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 Asthma   . Complication of anesthesia   . PONV (postoperative nausea and vomiting)   . GERD (gastroesophageal reflux disease)   . Hypertension     sts hasnt had any trouble or been on meds in 2 years.  . Degenerative disc disease   . Chronic back pain    Past Surgical History  Procedure Laterality Date  . Wisdom tooth extraction    . No past  surgeries    . Esophagogastroduodenoscopy  02/2014    Dr. Jeani HawkingPatrick Hung: (propofol) retained fluid/gastric contents found in gastric fundus and body. Fluid aspiration to clear gastric lumen. Patient developed asthmatic exacerbation during procedure and had to be woke up to receive nebulizer.  . Colonoscopy  02/2014    Dr. Luisa HartPatrick Hung:(propofol) normal  . Cholecystectomy N/A 04/14/2014    Procedure: LAPAROSCOPIC CHOLECYSTECTOMY;  Surgeon: Dalia HeadingMark A Jenkins, MD;  Location: [facility];  Service: General;  Laterality: N/A;  . Gallbladder surgery     Family History  Problem Relation Age of Onset  . Arthritis Mother   . Asthma Mother   . Depression Mother   . Hypertension Mother   . Miscarriages / Stillbirths Maternal Aunt   . Stroke Paternal Uncle   . Heart disease Paternal Uncle   . Cancer Maternal Grandfather     kidney  . Heart disease Paternal Grandmother    History  Substance Use Topics  . Smoking status: Former Smoker -- 0.50 packs/day for 5 years    Types: Cigarettes    Quit date: 04/11/2011  . Smokeless tobacco: Never Used  . Alcohol Use: No   OB History   Grav Para Term Preterm Abortions TAB SAB Ect Mult Living   2 1 1  0 1 1 0 0 0 1     Review of Systems  Constitutional:  Negative for fever and chills.  HENT: Negative for facial swelling and trouble swallowing.   Respiratory: Negative for cough and shortness of breath.   Gastrointestinal: Negative for nausea and vomiting.  Musculoskeletal: Negative for myalgias.  Skin: Positive for rash.  All other systems reviewed and are negative.     Allergies  Penicillins  Home Medications   Prior to Admission medications   Medication Sig Start Date End Date Taking? Authorizing Provider  ibuprofen (ADVIL,MOTRIN) 200 MG tablet Take 400 mg by mouth every 6 (six) hours as needed (pain).   Yes Historical Provider, MD  promethazine (PHENERGAN) 25 MG tablet Take 1 tablet (25 mg total) by mouth every 6 (six) hours as needed for nausea or  vomiting. 06/04/14  Yes Nira RetortAnna W Sams, NP  PROAIR HFA 108 (90 BASE) MCG/ACT inhaler Inhale 2 puffs into the lungs 2 (two) times daily as needed for wheezing or shortness of breath.  03/04/14   Historical Provider, MD   Triage Vitals: BP 107/92  Pulse 90  Temp(Src) 97.8 F (36.6 C) (Oral)  Resp 20  Ht 5\' 2"  (1.575 m)  Wt 159 lb (72.122 kg)  BMI 29.07 kg/m2  SpO2 98%  LMP 06/15/2014  Physical Exam  Nursing note and vitals reviewed. Constitutional: She appears well-developed and well-nourished. No distress.  HENT:  Head: Normocephalic and atraumatic.  Neck: Neck supple.  Cardiovascular: Normal rate, regular rhythm and normal heart sounds.   No murmur heard. Pulmonary/Chest: Effort normal and breath sounds normal. No respiratory distress. She has no wheezes. She has no rales.  Neurological: She is alert.  Skin: Rash noted. Rash is papular. She is not diaphoretic. There is erythema.  Erythematous papular rash with scaling over bilateral upper arms, posterior and medial aspects only. Two annular crusting lesions with central clearing that are also erythematous. Erythematous plaques to right chest wall. Contiguous erythematous papules and plaques over the right lower back and right upper lateral thigh.     ED Course  Procedures (including critical care time) DIAGNOSTIC STUDIES: Oxygen Saturation is 98% on room air, normal by my interpretation.    COORDINATION OF CARE: 3:09 PM - Patient informed of current plan for treatment and evaluation and agrees with plan at this time.    MDM   Final diagnoses:  Rash  Tinea corporis    Afebrile nontoxic patient with several areas of pruritic erythematous rash on upper arms and upper thighs.  Interestingly, the affected areas are approximately symmetric.  There are two annular lesions on the right upper arm appear to be tinea.  There are no areas that appear to be infected.  She has had no systemic symptoms.  It is unclear what the source of this  rash is.  D/C home with lotrimin, atarax, prednisone, PCP follow up.  May need dermatology follow up given mixed picture of the rash if it does not clear up with this treatment.  Discussed findings, treatment, and follow up  with patient.  Pt given return precautions.  Pt verbalizes understanding and agrees with plan.      I personally performed the services described in this documentation, which was scribed in my presence. The recorded information has been reviewed and is accurate.    NokomisEmily Leelynd Maldonado, PA-C 06/17/14 1639

## 2014-06-17 NOTE — Discharge Instructions (Signed)
Read the information below.  Use the prescribed medication as directed.  Please discuss all new medications with your pharmacist.  You may return to the Emergency Department at any time for worsening condition or any new symptoms that concern you.  If you develop redness, swelling, pus draining from the wound, or fevers greater than 100.4, return to the ER immediately for a recheck.  If you have any difficulty swallowing or breathing, or any itching or swelling in your mouth or throat, return to the ER for a recheck.    Body Ringworm Ringworm (tinea corporis) is a fungal infection of the skin on the body. This infection is not caused by worms, but is actually caused by a fungus. Fungus normally lives on the top of your skin and can be useful. However, in the case of ringworms, the fungus grows out of control and causes a skin infection. It can involve any area of skin on the body and can spread easily from one person to another (contagious). Ringworm is a common problem for children, but it can affect adults as well. Ringworm is also often found in athletes, especially wrestlers who share equipment and mats.  CAUSES  Ringworm of the body is caused by a fungus called dermatophyte. It can spread by:  Touchingother people who are infected.  Touchinginfected pets.  Touching or sharingobjects that have been in contact with the infected person or pet (hats, combs, towels, clothing, sports equipment). SYMPTOMS   Itchy, raised red spots and bumps on the skin.  Ring-shaped rash.  Redness near the border of the rash with a clear center.  Dry and scaly skin on or around the rash. Not every person develops a ring-shaped rash. Some develop only the red, scaly patches. DIAGNOSIS  Most often, ringworm can be diagnosed by performing a skin exam. Your caregiver may choose to take a skin scraping from the affected area. The sample will be examined under the microscope to see if the fungus is present.    TREATMENT  Body ringworm may be treated with a topical antifungal cream or ointment. Sometimes, an antifungal shampoo that can be used on your body is prescribed. You may be prescribed antifungal medicines to take by mouth if your ringworm is severe, keeps coming back, or lasts a long time.  HOME CARE INSTRUCTIONS   Only take over-the-counter or prescription medicines as directed by your caregiver.  Wash the infected area and dry it completely before applying yourcream or ointment.  When using antifungal shampoo to treat the ringworm, leave the shampoo on the body for 3-5 minutes before rinsing.   Wear loose clothing to stop clothes from rubbing and irritating the rash.  Wash or change your bed sheets every night while you have the rash.  Have your pet treated by your veterinarian if it has the same infection. To prevent ringworm:   Practice good hygiene.  Wear sandals or shoes in public places and showers.  Do not share personal items with others.  Avoid touching red patches of skin on other people.  Avoid touching pets that have bald spots or wash your hands after doing so. SEEK MEDICAL CARE IF:   Your rash continues to spread after 7 days of treatment.  Your rash is not gone in 4 weeks.  The area around your rash becomes red, warm, tender, and swollen. Document Released: 12/02/2000 Document Revised: 08/29/2012 Document Reviewed: 06/18/2012 [facility] Patient Information 2015 [employer], [place]. This information is not intended to replace advice given to  you by your health care provider. Make sure you discuss any questions you have with your health care provider.  Rash A rash is a change in the color or texture of your skin. There are many different types of rashes. You may have other problems that accompany your rash. CAUSES   Infections.  Allergic reactions. This can include allergies to pets or foods.  Certain medicines.  Exposure to certain chemicals, soaps, or  cosmetics.  Heat.  Exposure to poisonous plants.  Tumors, both cancerous and noncancerous. SYMPTOMS   Redness.  Scaly skin.  Itchy skin.  Dry or cracked skin.  Bumps.  Blisters.  Pain. DIAGNOSIS  Your caregiver may do a physical exam to determine what type of rash you have. A skin sample (biopsy) may be taken and examined under a microscope. TREATMENT  Treatment depends on the type of rash you have. Your caregiver may prescribe certain medicines. For serious conditions, you may need to see a skin doctor (dermatologist). HOME CARE INSTRUCTIONS   Avoid the substance that caused your rash.  Do not scratch your rash. This can cause infection.  You may take cool baths to help stop itching.  Only take over-the-counter or prescription medicines as directed by your caregiver.  Keep all follow-up appointments as directed by your caregiver. SEEK IMMEDIATE MEDICAL CARE IF:  You have increasing pain, swelling, or redness.  You have a fever.  You have new or severe symptoms.  You have body aches, diarrhea, or vomiting.  Your rash is not better after 3 days. MAKE SURE YOU:  Understand these instructions.  Will watch your condition.  Will get help right away if you are not doing well or get worse. Document Released: 11/25/2002 Document Revised: 02/27/2012 Document Reviewed: 09/19/2011 [facility] Patient Information 2015 [place], [place]. This information is not intended to replace advice given to you by your health care provider. Make sure you discuss any questions you have with your health care provider.

## 2014-06-17 NOTE — ED Notes (Signed)
Pt has hives, itchy red bumps to various places on her body. Reports last week she started a probiotic.

## 2014-06-18 NOTE — ED Provider Notes (Signed)
Medical screening examination/treatment/procedure(s) were performed by non-physician practitioner and as supervising physician I was immediately available for consultation/collaboration.   EKG Interpretation None        Mirren Gest, MD 06/18/14 2326 

## 2014-07-10 ENCOUNTER — Ambulatory Visit: Payer: No Typology Code available for payment source | Admitting: Gastroenterology

## 2014-07-11 ENCOUNTER — Ambulatory Visit (INDEPENDENT_AMBULATORY_CARE_PROVIDER_SITE_OTHER): Payer: No Typology Code available for payment source | Admitting: Internal Medicine

## 2014-07-11 ENCOUNTER — Encounter: Payer: Self-pay | Admitting: Internal Medicine

## 2014-07-11 VITALS — BP 127/84 | HR 85 | Temp 97.5°F | Ht 62.0 in | Wt 165.4 lb

## 2014-07-11 DIAGNOSIS — R142 Eructation: Secondary | ICD-10-CM

## 2014-07-11 DIAGNOSIS — R143 Flatulence: Secondary | ICD-10-CM

## 2014-07-11 DIAGNOSIS — R141 Gas pain: Secondary | ICD-10-CM

## 2014-07-11 DIAGNOSIS — R140 Abdominal distension (gaseous): Secondary | ICD-10-CM

## 2014-07-11 DIAGNOSIS — K219 Gastro-esophageal reflux disease without esophagitis: Secondary | ICD-10-CM

## 2014-07-11 NOTE — Patient Instructions (Addendum)
Continue Linzess daily  Add Dexilant 60 mg daily  Continue probiotic daily  See Dr. Sherwood GamblerFusco for rash, night sweats, insomnia and high blood pressure  May need to see a rheumatologist  Office visit in 2 months

## 2014-07-11 NOTE — Progress Notes (Signed)
Primary Care Physician:  Cassell SmilesFUSCO,LAWRENCE J., MD Primary Gastroenterologist:  Dr. Jena Gaussourk  Pre-Procedure History & Physical: HPI:  Kiara Dillon is a 33 y.o. female here for a multitude of GI and non-GI complaints. Unrelenting rash on her right anterior shoulder, abdominal bloating alternating constipation diarrhea. She seen multiple provider since earlier this year. Gallbladder out-chronic cholecystitis. EGD colonoscopy and [place] without significant findings. CT scan negative labs negative including a celiac screen. Gets spine injections periodically has been told she has had problems by her neurosurgeon. Has not passed any blood. Nausea but no vomiting takes Mobic. PPI stopped elsewhere because EGD was normal. Has known gastroparesis. Borderline delayed gastric emptying on GES.  Reglan caused muscle cramps. She states her bowel pain has improved since gallbladder was removed. LFTs repeatedly normal.  Night sweats frequently worse just get up and take a shower. Soaks the bed linen as she reports.  Has not informed PCP.          Marland Kitchen. Past Medical History  Diagnosis Date  . Cholestasis of pregnancy   . 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 Asthma   . Complication of anesthesia   . PONV (postoperative nausea and vomiting)   . GERD (gastroesophageal reflux disease)   . Hypertension     sts hasnt had any trouble or been on meds in 2 years.  . Degenerative disc disease   . Chronic back pain     Past Surgical History  Procedure Laterality Date  . Wisdom tooth extraction    . No past surgeries    . Esophagogastroduodenoscopy  02/2014    Dr. Jeani HawkingPatrick Hung: (propofol) retained fluid/gastric contents found in gastric fundus and body. Fluid aspiration to clear gastric lumen. Patient developed asthmatic exacerbation during procedure and had to be woke up to receive nebulizer.  . Colonoscopy  02/2014    Dr. Luisa HartPatrick Hung:(propofol) normal  . Cholecystectomy N/A 04/14/2014    Procedure: LAPAROSCOPIC CHOLECYSTECTOMY;  Surgeon: Dalia HeadingMark A Jenkins, MD;  Location: [facility];  Service: General;  Laterality: N/A;  . Gallbladder surgery      Prior to Admission medications   Medication Sig Start Date End Date Taking? Authorizing Provider  Linaclotide (LINZESS) 145 MCG CAPS capsule Take 145 mcg by mouth daily.   Yes Historical Provider, MD  meloxicam (MOBIC) 7.5 MG tablet daily. 05/29/14  Yes Historical Provider, MD  metoprolol tartrate (LOPRESSOR) 25 MG tablet daily as needed. 06/16/14  Yes Historical Provider, MD  PROAIR HFA 108 (90 BASE) MCG/ACT inhaler Inhale 2 puffs into the lungs 2 (two) times daily as needed for wheezing or shortness of breath.  03/04/14  Yes Historical Provider, MD  Probiotic Product (PROBIOTIC DAILY PO) Take by mouth.   Yes Historical Provider, MD  promethazine (PHENERGAN) 25 MG tablet Take 1 tablet (25 mg total) by mouth every 6 (six) hours as needed for nausea or vomiting. 06/04/14  Yes Nira RetortAnna W Sams, NP    Allergies as of 07/11/2014 - Review Complete 07/11/2014  Allergen Reaction Noted  . Penicillins Other (See Comments) 09/29/2011    Family History  Problem Relation Age of Onset  . Arthritis Mother   . Asthma Mother   . Depression Mother   . Hypertension Mother   . Miscarriages / Stillbirths Maternal Aunt   . Stroke Paternal Uncle   . Heart disease Paternal Uncle   . Cancer Maternal Grandfather     kidney  . Heart disease Paternal Grandmother  History   Social History  . Marital Status: Single    Spouse Name: N/A    Number of Children: 1  . Years of Education: N/A   Occupational History  .     Social History Main Topics  . Smoking status: Former Smoker -- 0.50 packs/day for 5 years    Types: Cigarettes    Quit date: 04/11/2011  . Smokeless tobacco: Never Used  . Alcohol Use: No  . Drug Use: No  . Sexual Activity: Yes    Birth Control/ Protection: None   Other Topics Concern  . Not on file   Social History Narrative  .  No narrative on file    Review of Systems: See HPI, otherwise negative ROS  Physical Exam: BP 127/84  Pulse 85  Temp(Src) 97.5 F (36.4 C) (Oral)  Ht 5\' 2"  (1.575 m)  Wt 165 lb 6.4 oz (75.025 kg)  BMI 30.24 kg/m2  LMP 07/09/2014 General:   Alert,  Well-developed, well-nourished, anxious, pleasant and cooperative in NAD Skin:  Intact without significant lesions or rashes. She does have a splotchy macular rash  overlying her right anterior shoulder Eyes:  Sclera clear, no icterus.   Conjunctiva pink. Ears:  Normal auditory acuity. Nose:  No deformity, discharge,  or lesions. Mouth:  No deformity or lesions. Neck:  Supple; no masses or thyromegaly. No significant cervical adenopathy. Lungs:  Clear throughout to auscultation.   No wheezes, crackles, or rhonchi. No acute distress. Heart:  Regular rate and rhythm; no murmurs, clicks, rubs,  or gallops. Abdomen: Non-distended, normal bowel sounds.  Soft and nontender without appreciable mass or hepatosplenomegaly. Laparoscopy port sites healing nicely. Pulses:  Normal pulses noted. Extremities:  Without clubbing or edema.  Impression:   Chronic nausea, abdominal bloating, alternating constipation and diarrhea.  She has already had a fairly extensive workup at this time.  Alternating constipation and diarrhea most likely fits with an element of irritable bowel syndrome. She may have a diffuse GI motility disorder. She has heartburn and likely has GERD but no complicated GERD on EGD recently. Delayed gastric emptying on GES. Intolerant to Reglan. On Mobic and no PPI - which may be contributing to some of her symptoms She likely does not have one diagnosis to unify all of her above symptoms. I am concerned about her hypertension, night sweats and rash. She probably needs a workup for secondary forms of hypertension including pheochromocytoma.  Query discoid lupus   Recommendations:   Continue Linzess daily  Add Dexilant 60 mg  daily  Continue probiotic daily  See Dr. Sherwood Gambler for rash, night sweats, insomnia and high blood pressure  May need to see a rheumatologist  Office visit in 2 months     Notice: This dictation was prepared with Dragon dictation along with smaller phrase technology. Any transcriptional errors that result from this process are unintentional and may not be corrected upon review.

## 2014-07-14 ENCOUNTER — Encounter: Payer: Self-pay | Admitting: Internal Medicine

## 2014-07-14 ENCOUNTER — Other Ambulatory Visit: Payer: Self-pay

## 2014-07-14 ENCOUNTER — Ambulatory Visit: Payer: No Typology Code available for payment source | Admitting: Internal Medicine

## 2014-07-14 MED ORDER — LINACLOTIDE 145 MCG PO CAPS: 145.0000 ug | ORAL_CAPSULE | Freq: Every day | ORAL | Status: DC

## 2014-07-14 NOTE — Progress Notes (Signed)
Pt is aware of OV on 9/21 at 1030 with AS and appt card mailed

## 2014-07-29 ENCOUNTER — Telehealth: Payer: Self-pay

## 2014-07-29 NOTE — Telephone Encounter (Signed)
Tried to do a PA with pts insurance for Linzess. Per [employer] pt must try and fail miralax or lactulose prior to them paying for linzess. Is it ok to change to one of these?

## 2014-07-29 NOTE — Telephone Encounter (Signed)
This has been sent to the pharmacy, along with denial letter from insurance.

## 2014-07-29 NOTE — Telephone Encounter (Signed)
Yes. Would try MiraLax 17 g once daily to twice daily as needed for constipation and we will go from there

## 2014-08-20 ENCOUNTER — Emergency Department (HOSPITAL_COMMUNITY): Payer: No Typology Code available for payment source

## 2014-08-20 ENCOUNTER — Emergency Department (HOSPITAL_COMMUNITY)
Admission: EM | Admit: 2014-08-20 | Discharge: 2014-08-20 | Disposition: A | Discharge status: Home / Self Care | Payer: No Typology Code available for payment source | Attending: Emergency Medicine | Admitting: Emergency Medicine

## 2014-08-20 ENCOUNTER — Encounter (HOSPITAL_COMMUNITY): Payer: Self-pay | Admitting: Emergency Medicine

## 2014-08-20 ENCOUNTER — Ambulatory Visit: Payer: No Typology Code available for payment source

## 2014-08-20 DIAGNOSIS — G8929 Other chronic pain: Secondary | ICD-10-CM | POA: Diagnosis not present

## 2014-08-20 DIAGNOSIS — I10 Essential (primary) hypertension: Secondary | ICD-10-CM | POA: Diagnosis not present

## 2014-08-20 DIAGNOSIS — S60229A Contusion of unspecified hand, initial encounter: Secondary | ICD-10-CM | POA: Insufficient documentation

## 2014-08-20 DIAGNOSIS — S60222A Contusion of left hand, initial encounter: Secondary | ICD-10-CM

## 2014-08-20 DIAGNOSIS — Z87891 Personal history of nicotine dependence: Secondary | ICD-10-CM | POA: Insufficient documentation

## 2014-08-20 DIAGNOSIS — Z8719 Personal history of other diseases of the digestive system: Secondary | ICD-10-CM | POA: Diagnosis not present

## 2014-08-20 DIAGNOSIS — J45909 Unspecified asthma, uncomplicated: Secondary | ICD-10-CM | POA: Insufficient documentation

## 2014-08-20 DIAGNOSIS — Z8619 Personal history of other infectious and parasitic diseases: Secondary | ICD-10-CM | POA: Insufficient documentation

## 2014-08-20 DIAGNOSIS — S6990XA Unspecified injury of unspecified wrist, hand and finger(s), initial encounter: Secondary | ICD-10-CM | POA: Diagnosis present

## 2014-08-20 DIAGNOSIS — R61 Generalized hyperhidrosis: Secondary | ICD-10-CM | POA: Insufficient documentation

## 2014-08-20 DIAGNOSIS — Z880 Allergy status to penicillin: Secondary | ICD-10-CM | POA: Diagnosis not present

## 2014-08-20 DIAGNOSIS — Z8739 Personal history of other diseases of the musculoskeletal system and connective tissue: Secondary | ICD-10-CM | POA: Diagnosis not present

## 2014-08-20 MED ORDER — TRAMADOL HCL 50 MG PO TABS
50.0000 mg | ORAL_TABLET | Freq: Four times a day (QID) | ORAL | Status: DC | PRN
  Administered 2014-08-20: 50 mg via ORAL
  Filled 2014-08-20: qty 1

## 2014-08-20 MED ORDER — NAPROXEN 250 MG PO TABS
500.0000 mg | ORAL_TABLET | Freq: Two times a day (BID) | ORAL | Status: DC
  Administered 2014-08-20: 500 mg via ORAL
  Filled 2014-08-20: qty 2

## 2014-08-20 MED ORDER — TRAMADOL HCL 50 MG PO TABS: 50.0000 mg | ORAL_TABLET | Freq: Four times a day (QID) | ORAL | Status: DC | PRN

## 2014-08-20 MED ORDER — NAPROXEN 500 MG PO TABS: 500.0000 mg | ORAL_TABLET | Freq: Two times a day (BID) | ORAL | Status: DC
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
## 2014-08-20 NOTE — Discharge Instructions (Signed)
The swelling and pain will get better with time.  Take medications as prescribed.   Hand Contusion A hand contusion is a deep bruise on your hand area. Contusions are the result of an injury that caused bleeding under the skin. The contusion may turn blue, purple, or yellow. Minor injuries will give you a painless contusion, but more severe contusions may stay painful and swollen for a few weeks. CAUSES  A contusion is usually caused by a blow, trauma, or direct force to an area of the body. SYMPTOMS   Swelling and redness of the injured area.  Discoloration of the injured area.  Tenderness and soreness of the injured area.  Pain. DIAGNOSIS  The diagnosis can be made by taking a history and performing a physical exam. An X-ray, CT scan, or MRI may be needed to determine if there were any associated injuries, such as broken bones (fractures). TREATMENT  Often, the best treatment for a hand contusion is resting, elevating, icing, and applying cold compresses to the injured area. Over-the-counter medicines may also be recommended for pain control. HOME CARE INSTRUCTIONS   Put ice on the injured area.  Put ice in a plastic bag.  Place a towel between your skin and the bag.  Leave the ice on for 15-20 minutes, 03-04 times a day.  Only take over-the-counter or prescription medicines as directed by your caregiver. Your caregiver may recommend avoiding anti-inflammatory medicines (aspirin, ibuprofen, and naproxen) for 48 hours because these medicines may increase bruising.  If told, use an elastic wrap as directed. This can help reduce swelling. You may remove the wrap for sleeping, showering, and bathing. If your fingers become numb, cold, or blue, take the wrap off and reapply it more loosely.  Elevate your hand with pillows to reduce swelling.  Avoid overusing your hand if it is painful. SEEK IMMEDIATE MEDICAL CARE IF:   You have increased redness, swelling, or pain in your  hand.  Your swelling or pain is not relieved with medicines.  You have loss of feeling in your hand or are unable to move your fingers.  Your hand turns cold or blue.  You have pain when you move your fingers.  Your hand becomes warm to the touch.  Your contusion does not improve in 2 days. MAKE SURE YOU:   Understand these instructions.  Will watch your condition.  Will get help right away if you are not doing well or get worse. Document Released: 05/27/2002 Document Revised: 08/29/2012 Document Reviewed: 05/28/2012 [facility] Patient Information 2015 [place], [place]. This information is not intended to replace advice given to you by your health care provider. Make sure you discuss any questions you have with your health care provider.

## 2014-09-04 ENCOUNTER — Ambulatory Visit: Payer: No Typology Code available for payment source | Attending: Neurosurgery

## 2014-09-04 DIAGNOSIS — M545 Low back pain, unspecified: Secondary | ICD-10-CM | POA: Insufficient documentation

## 2014-09-04 DIAGNOSIS — R5381 Other malaise: Secondary | ICD-10-CM | POA: Insufficient documentation

## 2014-09-04 DIAGNOSIS — IMO0001 Reserved for inherently not codable concepts without codable children: Secondary | ICD-10-CM | POA: Diagnosis present

## 2014-09-08 ENCOUNTER — Encounter: Payer: Self-pay | Admitting: Gastroenterology

## 2014-09-08 ENCOUNTER — Other Ambulatory Visit: Payer: Self-pay

## 2014-09-08 ENCOUNTER — Ambulatory Visit (INDEPENDENT_AMBULATORY_CARE_PROVIDER_SITE_OTHER): Payer: No Typology Code available for payment source | Admitting: Gastroenterology

## 2014-09-08 VITALS — BP 121/77 | HR 80 | Temp 97.9°F | Ht 62.5 in | Wt 163.6 lb

## 2014-09-08 DIAGNOSIS — R1013 Epigastric pain: Secondary | ICD-10-CM

## 2014-09-08 DIAGNOSIS — R109 Unspecified abdominal pain: Secondary | ICD-10-CM

## 2014-09-08 DIAGNOSIS — K3184 Gastroparesis: Secondary | ICD-10-CM

## 2014-09-08 MED ORDER — PROMETHAZINE HCL 25 MG PO TABS: 25.0000 mg | ORAL_TABLET | Freq: Four times a day (QID) | ORAL | Status: DC | PRN

## 2014-09-08 MED ORDER — DRONABINOL 2.5 MG PO CAPS: 2.5000 mg | ORAL_CAPSULE | Freq: Two times a day (BID) | ORAL | Status: DC

## 2014-09-08 NOTE — Progress Notes (Signed)
Referring Provider: Elfredia Nevins, MD Primary Care Physician:  Cassell Smiles., MD Primary GI: Dr. Jena Gauss   Chief Complaint  Patient presents with  . Follow-up    still having abdominal pain everyday    HPI:   Kiara Dillon presents today in follow-up with history of chronic abdominal pain, gastroparesis, IBS. EGD/TCS, CT, GES on file. Seeing rheumatologist Sept 30th, [facility]. Not as bloated as previously. Still with persistent abdominal pain. A lot of pain with sleeping and waking up. Feels like an elephant sitting on stomach. Hurts breathing in and out, hurts getting out of bed. Located upper abdomen, feels like ribcage inflamed. 3 periods within 30 days. Went to GYN for work-up. Not on pain medication anymore. In Physical Therapy for back. Has to have back surgery in the future. Quite nauseated in the mornings. Taking Zofran. Has alternating diarrhea and constipation. Tried Kefir, didn't notice a huge difference. No improvement with Reglan in the past, had muscle cramps. Mornings bad with nausea. Abdominal bloating noted.    Past Medical History  Diagnosis Date  . Cholestasis of pregnancy   . 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 Asthma   . Complication of anesthesia   . PONV (postoperative nausea and vomiting)   . GERD (gastroesophageal reflux disease)   . Hypertension     sts hasnt had any trouble or been on meds in 2 years.  . Degenerative disc disease   . Chronic back pain     Past Surgical History  Procedure Laterality Date  . Wisdom tooth extraction    . No past surgeries    . Esophagogastroduodenoscopy  02/2014    Dr. Jeani Hawking: (propofol) retained fluid/gastric contents found in gastric fundus and body. Fluid aspiration to clear gastric lumen. Patient developed asthmatic exacerbation during procedure and had to be woke up to receive nebulizer.  . Colonoscopy  02/2014    Dr. Luisa Hart Hung:(propofol) normal  .  Cholecystectomy N/A 04/14/2014    Procedure: LAPAROSCOPIC CHOLECYSTECTOMY;  Surgeon: Dalia Heading, MD;  Location: [facility];  Service: General;  Laterality: N/A;  . Gallbladder surgery      Current Outpatient Prescriptions  Medication Sig Dispense Refill  . ibuprofen (MIDOL CRAMP FORMULA MAX ST) 200 MG tablet Take 200 mg by mouth every 6 (six) hours as needed for mild pain.      Marland Kitchen ondansetron (ZOFRAN) 4 MG tablet Take 4 mg by mouth every 8 (eight) hours as needed for nausea or vomiting.       No current facility-administered medications for this visit.    Allergies as of 09/08/2014 - Review Complete 09/08/2014  Allergen Reaction Noted  . Penicillins Other (See Comments) 09/29/2011    Family History  Problem Relation Age of Onset  . Arthritis Mother   . Asthma Mother   . Depression Mother   . Hypertension Mother   . Miscarriages / Stillbirths Maternal Aunt   . Stroke Paternal Uncle   . Heart disease Paternal Uncle   . Cancer Maternal Grandfather     kidney  . Heart disease Paternal Grandmother     History   Social History  . Marital Status: Single    Spouse Name: N/A    Number of Children: 1  . Years of Education: N/A   Occupational History  .     Social History Main Topics  . Smoking status: Former Smoker -- 0.50 packs/day for 5 years  Types: Cigarettes    Quit date: 04/11/2011  . Smokeless tobacco: Never Used     Comment: Quit x 2 years  . Alcohol Use: No  . Drug Use: No  . Sexual Activity: Yes    Birth Control/ Protection: None   Other Topics Concern  . None   Social History Narrative  . None    Review of Systems: As mentioned in HPI   Physical Exam: BP 121/77  Pulse 80  Temp(Src) 97.9 F (36.6 C) (Oral)  Ht 5' 2.5" (1.588 m)  Wt 163 lb 9.6 oz (74.208 kg)  BMI 29.43 kg/m2  LMP 08/28/2014 General:   Alert and oriented. No distress noted. Pleasant and cooperative.  Head:  Normocephalic and atraumatic. Eyes:  Conjuctiva clear without scleral  icterus. Abdomen:  +BS, soft, TTP diffusely upper abdomen and non-distended. No rebound or guarding. No HSM or masses noted. +CARNETT'S SIGN Msk:  Symmetrical without gross deformities. Normal posture. Extremities:  Without edema. Neurologic:  Alert and  oriented x4;  grossly normal neurologically. Skin:  Intact without significant lesions or rashes. Psych:  Alert and cooperative. Normal mood and affect.

## 2014-09-08 NOTE — Assessment & Plan Note (Signed)
Intolerant to Reglan. Nausea persistent in mornings, no significant improvement with Zofran. Trial low-dose Marinol BID, phenergan prn. 3 month return.

## 2014-09-08 NOTE — Assessment & Plan Note (Signed)
+  Carnett's sign on exam. Thorough evaluation on file. Proceed with breath test to evaluate for bacterial overgrowth to wrap up evaluation. May need Pain Management Referral. Keep appt with Rheumatologist upcoming. 3 month return.

## 2014-09-08 NOTE — Patient Instructions (Signed)
Start taking Marinol twice a day with meals.   I have provided Phenergan to use as needed for breakthrough nausea.   I have ordered a breath test to check for bacterial overgrowth.  We will see you in 3 months!

## 2014-09-09 ENCOUNTER — Telehealth: Payer: Self-pay | Admitting: Internal Medicine

## 2014-09-09 NOTE — Telephone Encounter (Signed)
Pt has been reschedule to 09/16/14 @ 7:30 and she is aware

## 2014-09-09 NOTE — Telephone Encounter (Signed)
PATIENT CALLED WANTING TO RESCHEDULE HER PROCEDURE  PLEASE CALL HER AT [phone]

## 2014-09-09 NOTE — Progress Notes (Signed)
cc'ed to pcp °

## 2014-09-11 ENCOUNTER — Encounter: Payer: No Typology Code available for payment source | Admitting: Rehabilitation

## 2014-09-12 ENCOUNTER — Encounter (HOSPITAL_COMMUNITY): Payer: Self-pay | Admitting: Pharmacy Technician

## 2014-09-16 ENCOUNTER — Encounter (HOSPITAL_COMMUNITY)
Admission: RE | Disposition: A | Discharge status: Home / Self Care | Payer: Self-pay | Source: Ambulatory Visit | Attending: Internal Medicine

## 2014-09-16 ENCOUNTER — Ambulatory Visit (HOSPITAL_COMMUNITY)
Admission: RE | Admit: 2014-09-16 | Discharge: 2014-09-16 | Disposition: A | Discharge status: Home / Self Care | Payer: No Typology Code available for payment source | Source: Ambulatory Visit | Attending: Internal Medicine | Admitting: Internal Medicine

## 2014-09-16 DIAGNOSIS — R109 Unspecified abdominal pain: Secondary | ICD-10-CM | POA: Insufficient documentation

## 2014-09-16 HISTORY — PX: BACTERIAL OVERGROWTH TEST: SHX5739

## 2014-09-16 SURGERY — BREATH TEST, FOR INTESTINAL BACTERIAL OVERGROWTH

## 2014-09-16 MED ORDER — LACTULOSE 10 GM/15ML PO SOLN
ORAL | Status: AC
  Filled 2014-09-16: qty 60

## 2014-09-16 MED ORDER — LACTULOSE 10 GM/15ML PO SOLN
25.0000 g | Freq: Once | ORAL | Status: AC
  Administered 2014-09-16: 25 g via ORAL

## 2014-09-16 NOTE — Progress Notes (Signed)
No beans, bran or high fiber cereal the day before the procedure? no NPO except for water 12 hours before procedure? yes No smoking, sleeping or vigorous exercising for at least 30 before procedure? no Recent antibiotic use and/or diarrhea? no   If yes, physician notified.  Time Baseline 15 mins 30 mins 45 mins 60 mins 75 mins 90 mins 105 mins 120 mins 135 mins 150 mins 165 mins 180 mins  H2-ppm 1 1 1 1 2 3 2 1 2 1 1 2  1

## 2014-09-17 ENCOUNTER — Encounter (HOSPITAL_COMMUNITY): Payer: Self-pay | Admitting: Internal Medicine

## 2014-09-18 ENCOUNTER — Ambulatory Visit: Payer: No Typology Code available for payment source | Attending: Neurosurgery

## 2014-09-18 DIAGNOSIS — M545 Low back pain: Secondary | ICD-10-CM | POA: Diagnosis not present

## 2014-09-18 DIAGNOSIS — Z5189 Encounter for other specified aftercare: Secondary | ICD-10-CM | POA: Insufficient documentation

## 2014-09-18 DIAGNOSIS — R5381 Other malaise: Secondary | ICD-10-CM | POA: Insufficient documentation

## 2014-09-24 ENCOUNTER — Ambulatory Visit: Payer: No Typology Code available for payment source | Admitting: Rehabilitation

## 2014-09-24 DIAGNOSIS — Z5189 Encounter for other specified aftercare: Secondary | ICD-10-CM | POA: Diagnosis not present

## 2014-09-25 ENCOUNTER — Ambulatory Visit: Payer: No Typology Code available for payment source

## 2014-09-25 DIAGNOSIS — Z5189 Encounter for other specified aftercare: Secondary | ICD-10-CM | POA: Diagnosis not present

## 2014-10-08 ENCOUNTER — Ambulatory Visit: Payer: No Typology Code available for payment source

## 2014-10-08 ENCOUNTER — Telehealth: Payer: Self-pay

## 2014-10-08 DIAGNOSIS — Z5189 Encounter for other specified aftercare: Secondary | ICD-10-CM | POA: Diagnosis not present

## 2014-10-08 NOTE — Telephone Encounter (Signed)
I never got a copy of the test? We need to have endo fax this to us so I can read it. Then I can make further recommendations. Thanks!

## 2014-10-08 NOTE — Telephone Encounter (Signed)
Pt is calling to see if [facility] is going to be [facility] since her HBT was normal. Please advise

## 2014-10-09 NOTE — Telephone Encounter (Signed)
Tobi Bastosnna, the HBT results has been routed to your in-basket for your review.

## 2014-10-09 NOTE — Telephone Encounter (Signed)
Negative HBT as expected.   1. Needs Pain Management Referral 2. Keep appt with Rheumatology 3. If she has done this, may refer to [facility] for [facility] abdominal pain, gastroparesis.  4. Keep 3 month f/u.

## 2014-10-10 ENCOUNTER — Other Ambulatory Visit: Payer: Self-pay

## 2014-10-10 DIAGNOSIS — R109 Unspecified abdominal pain: Secondary | ICD-10-CM

## 2014-10-10 NOTE — Telephone Encounter (Signed)
Pain Management Referral for chronic abdominal pain, GI work-up unrevealing.

## 2014-10-10 NOTE — Telephone Encounter (Signed)
Referral has been made.

## 2014-10-20 ENCOUNTER — Encounter (HOSPITAL_COMMUNITY): Payer: Self-pay | Admitting: Internal Medicine

## 2014-10-30 IMAGING — NM NM HEPATO W/GB/PHARM/[PERSON_NAME]
2 series · 12 of 12 positions shown · non-contrast
Comparison: NM GASTRIC EMPTYING dated 03/12/2014; US ABDOMEN
COMPLETE dated 02/16/2014; CT ABD/PELVIS W CM dated 02/11/2014

CLINICAL DATA: Abdominal pain and vomiting

EXAM:
NUCLEAR MEDICINE HEPATOBILIARY IMAGING WITH GALLBLADDER EF
TECHNIQUE: Sequential images of the abdomen were obtained [DATE] minutes
following intravenous administration of radiopharmaceutical. After
slow intravenous infusion of 1.5 micrograms Cholecystokinin,
gallbladder ejection fraction was determined.
RADIOPHARMACEUTICALS:  5.0 m7iTc-44m Choletec

[hida · 3.20mm/px · 6 of 30 frames shown (1 of 2)]
[frame 3/30]
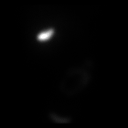
[frame 8/30]
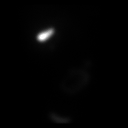
[frame 13/30]
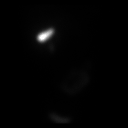
[frame 18/30]
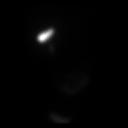
[frame 23/30]
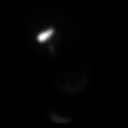
[frame 28/30]
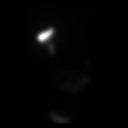

[hida · 3.20mm/px · 6 of 60 frames shown (2 of 2)]
[frame 6/60]
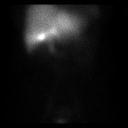
[frame 16/60]
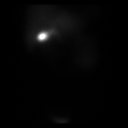
[frame 26/60]
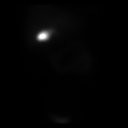
[frame 36/60]
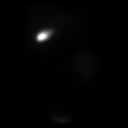
[frame 46/60]
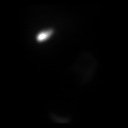
[frame 56/60]
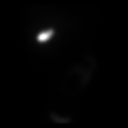

[12 of 12 positions shown; findings below may reference images not displayed]

FINDINGS: Uniform uptake of radiotracer within the liver. The gallbladder is
evident by 15 min. Counts are present within the small bowel by 20
min. Administration of CCK resulted in minimal contraction of the
gallbladder with a calculated ejection fraction of less than 5 %..
At 30 min, normal ejection fraction is greater than 30%.
IMPRESSION: 1. Low gallbladder ejection fraction = less than 5%. Common
differential diagnosis includes biliary dyskinesia, chronic
cholecystitis, and sphincter Oddi dysfunction.
2. Patent cystic duct and common bile duct.

## 2014-11-12 ENCOUNTER — Telehealth: Payer: Self-pay | Admitting: *Deleted

## 2014-11-20 NOTE — Telephone Encounter (Signed)
error 

## 2014-12-04 ENCOUNTER — Encounter (INDEPENDENT_AMBULATORY_CARE_PROVIDER_SITE_OTHER): Payer: Self-pay

## 2014-12-04 ENCOUNTER — Other Ambulatory Visit: Payer: Self-pay

## 2014-12-04 ENCOUNTER — Ambulatory Visit (INDEPENDENT_AMBULATORY_CARE_PROVIDER_SITE_OTHER): Payer: No Typology Code available for payment source | Admitting: Gastroenterology

## 2014-12-04 ENCOUNTER — Encounter: Payer: Self-pay | Admitting: Gastroenterology

## 2014-12-04 VITALS — BP 111/73 | HR 79 | Temp 97.5°F | Ht 62.0 in | Wt 165.4 lb

## 2014-12-04 DIAGNOSIS — K3184 Gastroparesis: Secondary | ICD-10-CM

## 2014-12-04 DIAGNOSIS — R109 Unspecified abdominal pain: Secondary | ICD-10-CM

## 2014-12-04 NOTE — Patient Instructions (Signed)
We have referred you to Pain Management to help decide the best medications for you. I feel we are dealing with abdominal wall pain.   Return in 3 months.   Have a good Christmas!

## 2014-12-04 NOTE — Progress Notes (Signed)
Referring Provider: Elfredia NevinsFusco, Lawrence, MD Primary Care Physician:  Cassell SmilesFUSCO,LAWRENCE J., MD Primary GI: Dr. Jena Gaussourk   Chief Complaint  Patient presents with  . Abdominal Pain    HPI:   Kiara Dillon presents today in follow-up with history of chronic abdominal pain, gastroparesis, IBS. EGD/TCS, CT, GES on file. HBT negative. Marinol BID prescribed in Sept 2015. Intolerant to Reglan. Hasn't seen any difference with Marinol, so stopped taking everything except for Phenergan. States food tears her up. Weight is up. Still with diarrhea/bloating, which she can deal with, but the chronic pain on left-side is frustrating. Will wake her up from her sleep.  Seeing the gynecologist today due to multiple periods. Saw the rheumatologist, diagnosed with fibromyalgia. Doesn't think she has an autoimmune issue. Notes chronic diarrhea. At least 3 loose stools per day. Finds herself not eating. Wants to get on the shakes. Constant nausea. After hot shower, pain is better. Laying/sleeping causes exacerbation of pain. Notes pain that extends from LUQ to LLQ, like a vertical band. Used to take a Xanax with some improvement. Actually states she has taken some Neurontin from her mom with improvement.   Past Medical History  Diagnosis Date  . Cholestasis of pregnancy   . 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 Asthma   . Complication of anesthesia   . PONV (postoperative nausea and vomiting)   . GERD (gastroesophageal reflux disease)   . Hypertension     sts hasnt had any trouble or been on meds in 2 years.  . Degenerative disc disease   . Chronic back pain     Past Surgical History  Procedure Laterality Date  . Wisdom tooth extraction    . No past surgeries    . Esophagogastroduodenoscopy  02/2014    Dr. Jeani HawkingPatrick Hung: (propofol) retained fluid/gastric contents found in gastric fundus and body. Fluid aspiration to clear gastric lumen. Patient developed asthmatic exacerbation during procedure  and had to be woke up to receive nebulizer.  . Colonoscopy  02/2014    Dr. Luisa HartPatrick Hung:(propofol) normal  . Cholecystectomy N/A 04/14/2014    Procedure: LAPAROSCOPIC CHOLECYSTECTOMY;  Surgeon: Dalia HeadingMark A Jenkins, MD;  Location: [facility];  Service: General;  Laterality: N/A;  . Gallbladder surgery    . Bacterial overgrowth test N/A 09/16/2014    Procedure: BACTERIAL OVERGROWTH TEST;  Surgeon: Corbin Adeobert M Rourk, MD;  Location: [facility];  Service: Endoscopy;  Laterality: N/A;  0730 - rescheduled to 9/29 - Ginger to notify pt    Current Outpatient Prescriptions  Medication Sig Dispense Refill  . promethazine (PHENERGAN) 25 MG tablet Take 1 tablet (25 mg total) by mouth every 6 (six) hours as needed for nausea or vomiting. 30 tablet 0   No current facility-administered medications for this visit.    Allergies as of 12/04/2014 - Review Complete 12/04/2014  Allergen Reaction Noted  . Penicillins Other (See Comments) 09/29/2011    Family History  Problem Relation Age of Onset  . Arthritis Mother   . Asthma Mother   . Depression Mother   . Hypertension Mother   . Miscarriages / Stillbirths Maternal Aunt   . Stroke Paternal Uncle   . Heart disease Paternal Uncle   . Cancer Maternal Grandfather     kidney  . Heart disease Paternal Grandmother     History   Social History  . Marital Status: Single    Spouse Name: N/A    Number of Children:  1  . Years of Education: N/A   Occupational History  .     Social History Main Topics  . Smoking status: Former Smoker -- 0.50 packs/day for 5 years    Types: Cigarettes    Quit date: 04/11/2011  . Smokeless tobacco: Never Used     Comment: Quit x 2 years  . Alcohol Use: No  . Drug Use: No  . Sexual Activity: Yes    Birth Control/ Protection: None   Other Topics Concern  . None   Social History Narrative    Review of Systems: Negative unless mentioned in HPI.   Physical Exam: BP 111/73 mmHg  Pulse 79  Temp(Src) 97.5 F (36.4 C)  (Oral)  Ht 5\' 2"  (1.575 m)  Wt 165 lb 6.4 oz (75.025 kg)  BMI 30.24 kg/m2 General:   Alert and oriented. No distress noted. Pleasant and cooperative.  Head:  Normocephalic and atraumatic. Eyes:  Conjuctiva clear without scleral icterus. Heart:  S1, S2 present without murmurs, rubs, or gallops. Regular rate and rhythm. Abdomen:  +BS, soft, TTP left-sided abdomen, +Carnett's sign.  non-distended. No rebound or guarding.  Extremities:  Without edema. Neurologic:  Alert and  oriented x4;  grossly normal neurologically. Skin:  Intact without significant lesions or rashes. Psych:  Alert and cooperative. Normal mood and affect.

## 2014-12-09 NOTE — Assessment & Plan Note (Signed)
Chronic with extensive work-up as outlined in HPI. As of note, she has reported improvement taking her mother's Neurontin. Will refer to pain management; I feel a trial of Neurontin would be appropriate. Extensive GI work-up has been undertaken, and she is agreeable to the referral. Return in 3 months.

## 2014-12-09 NOTE — Assessment & Plan Note (Signed)
Intolerant to Reglan, no improvement in Marinol. However, weight stable/increased. Use phenergan prn. Return in 3 months.

## 2014-12-12 ENCOUNTER — Encounter (HOSPITAL_COMMUNITY): Payer: Self-pay | Admitting: Emergency Medicine

## 2014-12-12 ENCOUNTER — Emergency Department (HOSPITAL_COMMUNITY)
Admission: EM | Admit: 2014-12-12 | Discharge: 2014-12-12 | Disposition: A | Discharge status: Home / Self Care | Payer: No Typology Code available for payment source | Attending: Emergency Medicine | Admitting: Emergency Medicine

## 2014-12-12 DIAGNOSIS — H5712 Ocular pain, left eye: Secondary | ICD-10-CM | POA: Insufficient documentation

## 2014-12-12 DIAGNOSIS — Z87891 Personal history of nicotine dependence: Secondary | ICD-10-CM | POA: Diagnosis not present

## 2014-12-12 DIAGNOSIS — Z8619 Personal history of other infectious and parasitic diseases: Secondary | ICD-10-CM | POA: Diagnosis not present

## 2014-12-12 DIAGNOSIS — J45909 Unspecified asthma, uncomplicated: Secondary | ICD-10-CM | POA: Diagnosis not present

## 2014-12-12 DIAGNOSIS — Z8719 Personal history of other diseases of the digestive system: Secondary | ICD-10-CM | POA: Insufficient documentation

## 2014-12-12 DIAGNOSIS — Z8739 Personal history of other diseases of the musculoskeletal system and connective tissue: Secondary | ICD-10-CM | POA: Diagnosis not present

## 2014-12-12 DIAGNOSIS — I10 Essential (primary) hypertension: Secondary | ICD-10-CM | POA: Insufficient documentation

## 2014-12-12 DIAGNOSIS — Z880 Allergy status to penicillin: Secondary | ICD-10-CM | POA: Insufficient documentation

## 2014-12-12 DIAGNOSIS — G8929 Other chronic pain: Secondary | ICD-10-CM | POA: Diagnosis not present

## 2014-12-12 MED ORDER — TETRACAINE HCL 0.5 % OP SOLN
1.0000 [drp] | Freq: Once | OPHTHALMIC | Status: AC
  Administered 2014-12-12: 1 [drp] via OPHTHALMIC
  Filled 2014-12-12: qty 2

## 2014-12-12 MED ORDER — FLUORESCEIN SODIUM 1 MG OP STRP
1.0000 | ORAL_STRIP | Freq: Once | OPHTHALMIC | Status: AC
  Administered 2014-12-12: 1 via OPHTHALMIC
  Filled 2014-12-12: qty 1

## 2014-12-12 MED ORDER — HYDROCODONE-ACETAMINOPHEN 5-325 MG PO TABS: 1.0000 | ORAL_TABLET | ORAL | Status: DC | PRN

## 2014-12-12 NOTE — ED Notes (Signed)
Equipment at the bedside for NP;.

## 2014-12-12 NOTE — ED Notes (Signed)
Patient here with complaint of left eye lid pain. States that it began yesterday and has not responded to warm compresses. Patient states that the eye itself doesn't really hurt, but if she moves the eye lid it is extremely painful. Area around left eye appears mildly swollen compared to right.

## 2014-12-12 NOTE — Discharge Instructions (Signed)
Pain of Unknown Etiology (Pain Without a Known Cause) You have come to your caregiver because of pain. Pain can occur in any part of the body. Often there is not a definite cause. If your laboratory (blood or urine) work was normal and X-rays or other studies were normal, your caregiver may treat you without knowing the cause of the pain. An example of this is the headache. Most headaches are diagnosed by taking a history. This means your caregiver asks you questions about your headaches. Your caregiver determines a treatment based on your answers. Usually testing done for headaches is normal. Often testing is not done unless there is no response to medications. Regardless of where your pain is located today, you can be given medications to make you comfortable. If no physical cause of pain can be found, most cases of pain will gradually leave as suddenly as they came.  If you have a painful condition and no reason can be found for the pain, it is important that you follow up with your caregiver. If the pain becomes worse or does not go away, it may be necessary to repeat tests and look further for a possible cause.  Only take over-the-counter or prescription medicines for pain, discomfort, or fever as directed by your caregiver.  For the protection of your privacy, test results cannot be given over the phone. Make sure you receive the results of your test. Ask how these results are to be obtained if you have not been informed. It is your responsibility to obtain your test results.  You may continue all activities unless the activities cause more pain. When the pain lessens, it is important to gradually resume normal activities. Resume activities by beginning slowly and gradually increasing the intensity and duration of the activities or exercise. During periods of severe pain, bed rest may be helpful. Lie or sit in any position that is comfortable.  Ice used for acute (sudden) conditions may be effective.  Use a large plastic bag filled with ice and wrapped in a towel. This may provide pain relief.  See your caregiver for continued problems. Your caregiver can help or refer you for exercises or physical therapy if necessary. If you were given medications for your condition, do not drive, operate machinery or power tools, or sign legal documents for 24 hours. Do not drink alcohol, take sleeping pills, or take other medications that may interfere with treatment. See your caregiver immediately if you have pain that is becoming worse and not relieved by medications. Document Released: 08/30/2001 Document Revised: 09/25/2013 Document Reviewed: 12/05/2005 [facility] Patient Information 2015 [place], [place]. This information is not intended to replace advice given to you by your health care provider. Make sure you discuss any questions you have with your health care provider. Tonight your eye was examined.  This examination did not really feel a cause for your discomfort.  You do not have a retained foreign body.  Do not have a corneal abrasion.  He did not have a stye do not have any lesions on the lid.  It is recommended that you see the ophthalmologist first thing in the morning.  Please call and update that U been referred to the emergency department and they will make every regimen to see you as soon as possible

## 2014-12-12 NOTE — ED Notes (Signed)
NP at the bedside

## 2014-12-12 NOTE — ED Notes (Signed)
Reports no change in vision.

## 2014-12-15 ENCOUNTER — Emergency Department (HOSPITAL_COMMUNITY)
Admission: EM | Admit: 2014-12-15 | Discharge: 2014-12-16 | Disposition: A | Discharge status: Home / Self Care | Payer: No Typology Code available for payment source | Attending: Emergency Medicine | Admitting: Emergency Medicine

## 2014-12-15 ENCOUNTER — Emergency Department (HOSPITAL_COMMUNITY): Payer: No Typology Code available for payment source

## 2014-12-15 ENCOUNTER — Encounter (HOSPITAL_COMMUNITY): Payer: Self-pay | Admitting: Family Medicine

## 2014-12-15 DIAGNOSIS — H5712 Ocular pain, left eye: Secondary | ICD-10-CM | POA: Diagnosis not present

## 2014-12-15 DIAGNOSIS — J45909 Unspecified asthma, uncomplicated: Secondary | ICD-10-CM | POA: Diagnosis not present

## 2014-12-15 DIAGNOSIS — R112 Nausea with vomiting, unspecified: Secondary | ICD-10-CM | POA: Diagnosis not present

## 2014-12-15 DIAGNOSIS — Z3202 Encounter for pregnancy test, result negative: Secondary | ICD-10-CM | POA: Insufficient documentation

## 2014-12-15 DIAGNOSIS — I10 Essential (primary) hypertension: Secondary | ICD-10-CM | POA: Insufficient documentation

## 2014-12-15 DIAGNOSIS — H469 Unspecified optic neuritis: Secondary | ICD-10-CM | POA: Diagnosis not present

## 2014-12-15 DIAGNOSIS — Z8739 Personal history of other diseases of the musculoskeletal system and connective tissue: Secondary | ICD-10-CM | POA: Diagnosis not present

## 2014-12-15 DIAGNOSIS — R6883 Chills (without fever): Secondary | ICD-10-CM | POA: Diagnosis not present

## 2014-12-15 DIAGNOSIS — H53149 Visual discomfort, unspecified: Secondary | ICD-10-CM | POA: Diagnosis not present

## 2014-12-15 DIAGNOSIS — G8929 Other chronic pain: Secondary | ICD-10-CM | POA: Insufficient documentation

## 2014-12-15 DIAGNOSIS — Z8719 Personal history of other diseases of the digestive system: Secondary | ICD-10-CM | POA: Insufficient documentation

## 2014-12-15 DIAGNOSIS — Z880 Allergy status to penicillin: Secondary | ICD-10-CM | POA: Diagnosis not present

## 2014-12-15 DIAGNOSIS — Z87891 Personal history of nicotine dependence: Secondary | ICD-10-CM | POA: Diagnosis not present

## 2014-12-15 DIAGNOSIS — H578 Other specified disorders of eye and adnexa: Secondary | ICD-10-CM | POA: Diagnosis present

## 2014-12-15 LAB — COMPREHENSIVE METABOLIC PANEL
ALBUMIN: 4.1 g/dL (ref 3.5–5.2)
ALK PHOS: 75 U/L (ref 39–117)
ALT: 13 U/L (ref 0–35)
AST: 15 U/L (ref 0–37)
Anion gap: 7 (ref 5–15)
BILIRUBIN TOTAL: 0.4 mg/dL (ref 0.3–1.2)
BUN: 8 mg/dL (ref 6–23)
CHLORIDE: 105 meq/L (ref 96–112)
CO2: 27 mmol/L (ref 19–32)
CREATININE: 0.89 mg/dL (ref 0.50–1.10)
Calcium: 9.1 mg/dL (ref 8.4–10.5)
GFR calc Af Amer: >90 mL/min (ref >90)
GFR, EST NON AFRICAN AMERICAN: 85 mL/min — AB (ref >90)
Glucose, Bld: 101 mg/dL — ABNORMAL HIGH (ref 70–99)
POTASSIUM: 3.3 mmol/L — AB (ref 3.5–5.1)
Sodium: 139 mmol/L (ref 135–145)
Total Protein: 7 g/dL (ref 6.0–8.3)

## 2014-12-15 LAB — CBC WITH DIFFERENTIAL/PLATELET
BASOS PCT: 0 % (ref 0–1)
Basophils Absolute: 0 10*3/uL (ref 0.0–0.1)
EOS PCT: 3 % (ref 0–5)
Eosinophils Absolute: 0.3 10*3/uL (ref 0.0–0.7)
HCT: 41.8 % (ref 36.0–46.0)
Hemoglobin: 14.2 g/dL (ref 12.0–15.0)
LYMPHS ABS: 1.3 10*3/uL (ref 0.7–4.0)
Lymphocytes Relative: 15 % (ref 12–46)
MCH: 29.2 pg (ref 26.0–34.0)
MCHC: 34 g/dL (ref 30.0–36.0)
MCV: 86 fL (ref 78.0–100.0)
MONOS PCT: 5 % (ref 3–12)
Monocytes Absolute: 0.4 10*3/uL (ref 0.1–1.0)
Neutro Abs: 6.4 10*3/uL (ref 1.7–7.7)
Neutrophils Relative %: 77 % (ref 43–77)
PLATELETS: 191 10*3/uL (ref 150–400)
RBC: 4.86 MIL/uL (ref 3.87–5.11)
RDW: 13.2 % (ref 11.5–15.5)
WBC: 8.4 10*3/uL (ref 4.0–10.5)

## 2014-12-15 LAB — POC URINE PREG, ED: PREG TEST UR: NEGATIVE

## 2014-12-15 MED ORDER — METHYLPREDNISOLONE SODIUM SUCC 125 MG IJ SOLR
125.0000 mg | Freq: Once | INTRAMUSCULAR | Status: AC
  Administered 2014-12-15: 125 mg via INTRAVENOUS
  Filled 2014-12-15: qty 2

## 2014-12-15 MED ORDER — ONDANSETRON 4 MG PO TBDP
8.0000 mg | ORAL_TABLET | Freq: Once | ORAL | Status: AC
  Administered 2014-12-15: 8 mg via ORAL
  Filled 2014-12-15: qty 2

## 2014-12-15 MED ORDER — SODIUM CHLORIDE 0.9 % IV BOLUS (SEPSIS)
1000.0000 mL | Freq: Once | INTRAVENOUS | Status: AC
Start: 2014-12-15 — End: 2014-12-16
  Administered 2014-12-15: 1000 mL via INTRAVENOUS

## 2014-12-15 MED ORDER — GADOBENATE DIMEGLUMINE 529 MG/ML IV SOLN
15.0000 mL | Freq: Once | INTRAVENOUS | Status: AC | PRN
  Administered 2014-12-15: 15 mL via INTRAVENOUS

## 2014-12-15 NOTE — ED Provider Notes (Signed)
CSN: [id number]     Arrival date & time 12/15/14  1709 History   First MD Initiated Contact with Patient 12/15/14 1842     Chief Complaint  Patient presents with  . Eye Problem     (Consider location/radiation/quality/duration/timing/severity/associated sxs/prior Treatment) Patient is a 33 y.o. female presenting with eye problem. The history is provided by the patient. No language interpreter was used.  Eye Problem Associated symptoms: discharge (Clear), nausea, photophobia and vomiting   Associated symptoms: no headaches, no numbness and no weakness   Kiara Dillon is a 33 y/o F with PMHx of asthma, GERD, hypertension, chronic back pain presenting to the ED with left eye pain has been ongoing since 12/11/2014. Patient reported that the pain is localized to the left eye described as if there is something stuck in her eye undraped the left eyelid. Reported that she was seen and assessed in ED setting on Christmas night where she was diagnosed with left eye pain and given Vicodin. Patient reports that in the morning she has to pry her eye open secondary to the eyelids being stuck together. Stated that she has clear tears continuously. Reported that he feels as if something is stuck underneath her left eyelid and is scratching her eye. Stated that yesterday she had 2 episodes of emesis-NB/NB. Reported that she had subjective fever. Denied vomiting today. Patient reported that she was seen and assessed by an eye doctor today who did a full examination and check pressures-as per patient reported there was no corneal abrasion or change to pressures. Stated that physician recommended patient to get MRI of the orbit secondary to identifying mild swelling to the left eye. Patient had prescription with her-patient seen by Dr[facility] Delora Fuel at [facility] who recommended MRI of orbits to be performed. Denied sudden loss of vision, blurred vision, neck pain, neck stiffness, ear pain, chest pain,  shortness of breath, difficulty breathing, abdominal pain. PCP Dr. Sherwood Gambler  Past Medical History  Diagnosis Date  . Cholestasis of pregnancy   . 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 Asthma   . Complication of anesthesia   . PONV (postoperative nausea and vomiting)   . GERD (gastroesophageal reflux disease)   . Hypertension     sts hasnt had any trouble or been on meds in 2 years.  . Degenerative disc disease   . Chronic back pain    Past Surgical History  Procedure Laterality Date  . Wisdom tooth extraction    . No past surgeries    . Esophagogastroduodenoscopy  02/2014    Dr. Jeani Hawking: (propofol) retained fluid/gastric contents found in gastric fundus and body. Fluid aspiration to clear gastric lumen. Patient developed asthmatic exacerbation during procedure and had to be woke up to receive nebulizer.  . Colonoscopy  02/2014    Dr. Luisa Hart Hung:(propofol) normal  . Cholecystectomy N/A 04/14/2014    Procedure: LAPAROSCOPIC CHOLECYSTECTOMY;  Surgeon: Dalia Heading, MD;  Location: [facility];  Service: General;  Laterality: N/A;  . Gallbladder surgery    . Bacterial overgrowth test N/A 09/16/2014    Procedure: BACTERIAL OVERGROWTH TEST;  Surgeon: Corbin Ade, MD;  Location: [facility];  Service: Endoscopy;  Laterality: N/A;  0730 - rescheduled to 9/29 - Ginger to notify pt   Family History  Problem Relation Age of Onset  . Arthritis Mother   . Asthma Mother   . Depression Mother   . Hypertension Mother   .  Miscarriages / Stillbirths Maternal Aunt   . Stroke Paternal Uncle   . Heart disease Paternal Uncle   . Cancer Maternal Grandfather     kidney  . Heart disease Paternal Grandmother    History  Substance Use Topics  . Smoking status: Former Smoker -- 0.50 packs/day for 5 years    Types: Cigarettes    Quit date: 04/11/2011  . Smokeless tobacco: Never Used     Comment: Quit x 2 years  . Alcohol Use: No   OB History    Gravida Para Term Preterm AB  TAB SAB Ectopic Multiple Living   2 1 1  0 1 1 0 0 0 1     Review of Systems  Constitutional: Positive for chills. Negative for fever.  Eyes: Positive for photophobia, pain and discharge (Clear). Negative for visual disturbance.  Respiratory: Negative for chest tightness and shortness of breath.   Cardiovascular: Negative for chest pain.  Gastrointestinal: Positive for nausea and vomiting. Negative for abdominal pain, diarrhea, constipation, blood in stool and anal bleeding.  Musculoskeletal: Negative for back pain and neck pain.  Neurological: Negative for dizziness, weakness, numbness and headaches.      Allergies  Penicillins  Home Medications   Prior to Admission medications   Medication Sig Start Date End Date Taking? Authorizing Provider  HYDROcodone-acetaminophen (NORCO/VICODIN) 5-325 MG per tablet Take 1 tablet by mouth every 4 (four) hours as needed. 12/12/14  Yes Arman Filter, NP  promethazine (PHENERGAN) 25 MG tablet Take 1 tablet (25 mg total) by mouth every 6 (six) hours as needed for nausea or vomiting. 09/08/14  Yes Nira Retort, NP  predniSONE (DELTASONE) 10 MG tablet Please take 60 mg (6 tablets) daily for one week. On day 8, please take 50 mg (5 tablets) once PO. On day 9, please take 40 mg (4 tablets) PO once. On day 10, please take 30 mg (3 tablets) PO once. On day 11, please take 20 mg (2 tablets) PO once. On day 12, please take 10 mg (1 tablet) PO once. 12/16/14   Renan Danese, PA-C   BP 121/78 mmHg  Pulse 74  Temp(Src) 97.8 F (36.6 C) (Oral)  Resp 20  Ht 5\' 2"  (1.575 m)  Wt 165 lb (74.844 kg)  BMI 30.17 kg/m2  SpO2 99%  LMP 12/12/2014 Physical Exam  Constitutional: She is oriented to person, place, and time. She appears well-developed and well-nourished. No distress.  HENT:  Head: Normocephalic and atraumatic.  Eyes: Conjunctivae, EOM and lids are normal. Pupils are equal, round, and reactive to light. Right eye exhibits no chemosis, no discharge and  no exudate. No foreign body present in the right eye. Left eye exhibits no chemosis, no discharge and no exudate. No foreign body present in the left eye. Right conjunctiva is not injected. Right conjunctiva has no hemorrhage. Left conjunctiva is not injected. Left conjunctiva has no hemorrhage. Right eye exhibits normal extraocular motion and no nystagmus. Left eye exhibits normal extraocular motion and no nystagmus. Right pupil is round and reactive. Left pupil is round and reactive.  Neck: Normal range of motion. Neck supple. No tracheal deviation present.  Cardiovascular: Normal rate, regular rhythm and normal heart sounds.   Pulmonary/Chest: Effort normal and breath sounds normal. No respiratory distress. She has no wheezes. She has no rales.  Musculoskeletal: Normal range of motion.  Lymphadenopathy:    She has no cervical adenopathy.  Neurological: She is alert and oriented to person, place, and time. No cranial  nerve deficit. She exhibits normal muscle tone. Coordination normal.  Skin: Skin is warm and dry. No rash noted. She is not diaphoretic. No erythema.  Psychiatric: She has a normal mood and affect. Her behavior is normal. Thought content normal.  Nursing note and vitals reviewed.   ED Course  Procedures (including critical care time)  Results for orders placed or performed during the hospital encounter of 12/15/14  CBC with Differential  Result Value Ref Range   WBC 8.4 4.0 - 10.5 K/uL   RBC 4.86 3.87 - 5.11 MIL/uL   Hemoglobin 14.2 12.0 - 15.0 g/dL   HCT 16.141.8 09.636.0 - 04.546.0 %   MCV 86.0 78.0 - 100.0 fL   MCH 29.2 26.0 - 34.0 pg   MCHC 34.0 30.0 - 36.0 g/dL   RDW 40.913.2 81.111.5 - 91.415.5 %   Platelets 191 150 - 400 K/uL   Neutrophils Relative % 77 43 - 77 %   Neutro Abs 6.4 1.7 - 7.7 K/uL   Lymphocytes Relative 15 12 - 46 %   Lymphs Abs 1.3 0.7 - 4.0 K/uL   Monocytes Relative 5 3 - 12 %   Monocytes Absolute 0.4 0.1 - 1.0 K/uL   Eosinophils Relative 3 0 - 5 %   Eosinophils  Absolute 0.3 0.0 - 0.7 K/uL   Basophils Relative 0 0 - 1 %   Basophils Absolute 0.0 0.0 - 0.1 K/uL  Comprehensive metabolic panel  Result Value Ref Range   Sodium 139 135 - 145 mmol/L   Potassium 3.3 (L) 3.5 - 5.1 mmol/L   Chloride 105 96 - 112 mEq/L   CO2 27 19 - 32 mmol/L   Glucose, Bld 101 (H) 70 - 99 mg/dL   BUN 8 6 - 23 mg/dL   Creatinine, Ser 7.820.89 0.50 - 1.10 mg/dL   Calcium 9.1 8.4 - 95.610.5 mg/dL   Total Protein 7.0 6.0 - 8.3 g/dL   Albumin 4.1 3.5 - 5.2 g/dL   AST 15 0 - 37 U/L   ALT 13 0 - 35 U/L   Alkaline Phosphatase 75 39 - 117 U/L   Total Bilirubin 0.4 0.3 - 1.2 mg/dL   GFR calc non Af Amer 85 (L) >90 mL/min   GFR calc Af Amer >90 >90 mL/min   Anion gap 7 5 - 15  [facility] preg[facility]  Result Value Ref Range   Preg Test, Ur NEGATIVE NEGATIVE    Labs Review Labs Reviewed  COMPREHENSIVE METABOLIC PANEL - Abnormal; Notable for the following:    Potassium 3.3 (*)    Glucose, Bld 101 (*)    GFR calc non Af Amer 85 (*)    All other components within normal limits  CBC WITH DIFFERENTIAL  POC URINE PREG, ED    Imaging Review Mr Birdie HopesOrbits Wo/w Cm  12/15/2014   CLINICAL DATA:  Initial evaluation for left optic nerve swelling seen by a eye doctor today. Left eye pain.  EXAM: MRI FACE AND NECK WITHOUT AND WITH CONTRAST  TECHNIQUE: Multiplanar, multisequence MR imaging was performed both before and after administration of intravenous contrast.  CONTRAST:  15mL MULTIHANCE GADOBENATE DIMEGLUMINE 529 MG/ML IV SOLN  COMPARISON:  None.  FINDINGS: The globes are symmetric in size and appearance without intra-ocular abnormality. The extraocular muscles are normal. Intraconal and extraconal fat are well preserved.  On coronal post gadolinium images, there is question of subtle fuzziness of the left optic nerve/sheath, which may reflect mild left optic neuritis. There  is subtle asymmetric enhancement about the left optic nerve on corresponding axial sequence (series 10, image 7).  No optic nerve enlargement identified. The optic chiasm is normal.  No signal abnormality identified within the visualized portions of the brain. Cerebral volume appears to be normal. No restricted diffusion identified to suggest acute intracranial infarct. No definite extra-axial fluid collection.  Craniocervical junction is normal. Visualized upper cervical spine within normal limits.  Pituitary gland normal.  Pituitary stalk is midline.  Moderate mucoperiosteal thickening present within the left maxillary sinus and ethmoidal air cells bilaterally. There is mild mucosal thickening within the sphenoid sinuses and right maxillary sinus as well. No air-fluid levels. No mastoid effusion.  IMPRESSION: 1. Question subtle asymmetric fuzziness and enhancement about the left optic nerve, suggesting left optic neuritis. Correlation with symptomatology recommended. 2. Mild to moderate paranasal sinus disease as above.   Electronically Signed   By: Rise MuBenjamin  McClintock M.D.   On: 12/15/2014 22:46     EKG Interpretation None       7:37 PM Tried to call Dr. Delora FuelBrent Johnson, O.D - office closed.   11;22 PM This provider spoke with Dr. Roseanne RenoStewart, Neuro on-call physician. Discussed case, labs, MRI in great detail. Agreed with IV steroids here in ED setting. As per physician, reported that patient does not need to be admitted to the hospital. Recommended prednisone 60 mg per day for 1 week to taper 10 mg daily. Recommended patient be seen by her primary care provider for MRI of the brain to be performed. Recommended outpatient Neurology follow-up.   MDM   Final diagnoses:  Eye pain, left  Optic neuritis    Medications  ondansetron (ZOFRAN-ODT) disintegrating tablet 8 mg (8 mg Oral Given 12/15/14 1951)  gadobenate dimeglumine (MULTIHANCE) injection 15 mL (15 mLs Intravenous Contrast Given 12/15/14 2200)  methylPREDNISolone sodium succinate (SOLU-MEDROL) 125 mg/2 mL injection 125 mg (125 mg Intravenous Given  12/15/14 2349)  sodium chloride 0.9 % bolus 1,000 mL (1,000 mLs Intravenous New Bag/Given 12/15/14 2352)    Filed Vitals:   12/15/14 2245 12/15/14 2300 12/15/14 2315 12/15/14 2345  BP: 118/76 114/60 102/72 121/78  Pulse: 74 79 69 74  Temp:      TempSrc:      Resp:   18 20  Height:      Weight:      SpO2: 98% 99% 98% 99%   CBC unremarkable-negative elevated white blood cell count. Hemoglobin 14.2, hematocrit 41.8. CMP mild hypokalemia 3.3-unremarkable. Urine pregnancy negative. MRI of orbits without contrast noted questionable asymmetric fuzziness and enhancement about the left optic nerve deficit just left optic neuritis. No signal abnormalities identified within the visual portions of the brain. Cerebral volume appears to be normal. No restricted diffusion identified to suggest acute intracranial infarct. No definite extra-axial fluid collection. Patient presenting to the ED with left optic neuritis as noted on MRI of orbits. Patient given IV fluids and Solu-Medrol. Neurology consulted who recommended patient to be discharged with Prednisone tapering dose and for patient to have outpatient follow up with PCP and Neurology. Recommended patient to get MRI of brain as an outpatient.  Patient stable, afebrile. Patient not septic appearing. Discharged patient. Discharged patient with prednisone as instructed by neurologist, Dr. Roseanne RenoStewart. Referred patient to PCP and neurologist and ophthalmologist. Discussed with patient to take medications as prescribed and on a full stomach. Please avoid any physical or strenuous activity. Discussed with patient the importance of following up and getting MRI of brain secondary to risk of  MS with beginnings of optic neuritis - educated patient on MS. Discussed with patient to closely monitor symptoms and if symptoms are to worsen or change to report back to the ED - strict return instructions given.  Patient agreed to plan of care, understood, all questions answered.    Raymon Mutton, PA-C 12/16/14 0116  Raymon Mutton, PA-C 12/16/14 1546  Donnetta Hutching, MD 12/16/14 [phone]

## 2014-12-15 NOTE — ED Notes (Signed)
Patient has not returned from MRI

## 2014-12-15 NOTE — ED Notes (Signed)
Phlebotomy and PA at the bedside.  

## 2014-12-15 NOTE — ED Notes (Signed)
Pt seen by her eye doctor today for pain to left eyelid and eye. sts they saw some swelling to nerve and reccommended MRI. Pt also having malaise and vomiting.

## 2014-12-16 MED ORDER — PREDNISONE 10 MG PO TABS: ORAL_TABLET | ORAL | Status: DC

## 2014-12-16 NOTE — Discharge Instructions (Signed)
Please call your doctor for a followup appointment within 24-48 hours. When you talk to your doctor please let them know that you were seen in the emergency department and have them acquire all of your records so that they can discuss the findings with you and formulate a treatment plan to fully care for your new and ongoing problems. Please call your primary care provider first thing tomorrow morning to discuss findings on the MRI and diagnosis. Will need to schedule an MRI of the brain without contrast as soon as possible. Please follow-up with neurology Please take medications as prescribed and on a full stomach Please continue to monitor symptoms closely and if symptoms are to worsen or change (fever greater than 101, chills, sweating, nausea, vomiting, chest pain, shortness of breathe, difficulty breathing, weakness, numbness, tingling, worsening or changes to pain pattern, neck pain, neck stiffness, inability to keep food or fluids down, fall, head injury) please report back to the Emergency Department immediately.    Emergency Department Resource Guide 1) Find a Doctor and Pay Out of Pocket Although you won't have to find out who is covered by your insurance plan, it is a good idea to ask around and get recommendations. You will then need to call the office and see if the doctor you have chosen will accept you as a new patient and what types of options they offer for patients who are self-pay. Some doctors offer discounts or will set up payment plans for their patients who do not have insurance, but you will need to ask so you aren't surprised when you get to your appointment.  2) Contact Your Local Health Department Not all health departments have doctors that can see patients for sick visits, but many do, so it is worth a call to see if yours does. If you don't know where your local health department is, you can check in your phone book. The CDC also has a tool to help you locate your state's  health department, and many state websites also have listings of all of their local health departments.  3) Find a Walk-in Clinic If your illness is not likely to be very severe or complicated, you may want to try a walk in clinic. These are popping up all over the country in pharmacies, drugstores, and shopping centers. They're usually staffed by nurse practitioners or physician assistants that have been trained to treat common illnesses and complaints. They're usually fairly quick and inexpensive. However, if you have serious medical issues or chronic medical problems, these are probably not your best option.  No Primary Care Doctor: - Call Health Connect at  [phone] - they can help you locate a primary care doctor that  accepts your insurance, provides certain services, etc. - Physician Referral Service- [phone]  Chronic Pain Problems: Organization         Address  Phone   Notes  [facility]  [phone] Patients need to be referred by their primary care doctor.   Medication Assistance: Organization         Address  Phone   Notes  [facility] Med[facility] [address] [place], [place] [address] [phone] --Must be a resident of [facility] -- Must have NO insurance coverage whatsoever (no Medicaid/ Medicare, etc.) -- The pt. MUST have a primary care doctor that directs their care regularly and follows them in the community   Med[facility]ist  [phone]   [address]  (  [phone]) [phone]    Agencies that provide inexpensive medical care: Organization         Address  Phone   Notes  [facility]  [phone]   [facility]    [phone]) [phone]   [facility] [address] [place], [place] [address] [phone]) [phone]   [facility] [address], [place] [phone]) [phone]   Planned Parenthood    [phone]) [phone]   [facility]    [phone]    [facility]  [address], [place] Phone:  [phone]) [phone], Fax:  [phone] Hours of Operation:  9 am - 6 pm, M-F.  Also accepts Medicaid/Medicare and self-pay.  [facility]  [address], [place] Phone: [phone], Fax: [phone]. Hours of Operation:  8:30 am - 5:30 pm, M-F.  Also accepts Medicaid and self-pay.  [facility] [place] [address], [place] Phone: [phone]) [phone]   [facility] [address], [place], [place] [phone], Ext. [phone] Mondays & Thursdays: 7-9 AM.  First 15 patients are seen on a first come, first serve basis.    Medicaid-accepting [facility] Providers:  Organization         Address  Phone   Notes  [facility] [address], [place] [phone]) [phone] Also accepts self-pay patients.  [facility] [address], [place]  [phone](336) [phone]   [facility] [address], [address], [place] [phone]) [phone]   [facility] [address] [place] [address], [place] [phone]) [phone]   Renaye RakersVeita Bland [address], [place]   [phone]) [phone] Only accepts [place] Access [place] patients after they have their name applied to their card.   Self-Pay (no insurance) in [facility]:  Organization         Address  Phone   Notes  Sickle Cell Patients, [facility] [address], [place] [phone](336) [phone]   [facility] [address], [place] [phone]) [phone]   [facility] [place]  [address], [place] [phone](336) [phone]   [facility]/Dr. Osei-Bonsu  [address] [place] [address], [place] or [phone], [place] [phone]) [phone] Phone number for both [place] and [place] locations is the same.  Urgent Medical and [facility] [address], [place] [phone]) [phone]     [facility] [place] [address] [place] [address], [place] or [address] [phone]) [phone]   [facility] [address], [place] [phone](336) [phone], phone; [phone]) [phone], fax Sees patients 1st and 3rd Saturday of every month.  Must not qualify for public or private insurance (i.e. Medicaid, Medicare, [place] Health Choice, Veterans' Benefits)  Household income should be no more than 200% of the poverty level The clinic cannot treat you if you are pregnant or think you are pregnant  Sexually transmitted diseases are not treated at the clinic.    Dental Care: Organization         Address  Phone  Notes  [facility] [address], [place] [phone]) [phone] Accepts children up to age 32 who are enrolled in [place] or [place] Health Choice; pregnant women with a Medicaid card; and children  who have applied for Medicaid or [place] Health Choice, but were declined, whose parents can pay a reduced fee at time of service.  [facility] Department of [facility]  [address], [place] [phone] Accepts children up to age 69 who are enrolled in [place] or [place] Health Choice; pregnant women with a Medicaid card; and children who have applied for Medicaid or [place] Health Choice, but were declined, whose parents can pay a reduced fee at time of service.  [facility]  [address] [place], [place] [phone] Patients are seen by appointment only. Walk-ins are not accepted. [facility] will see patients 53 years of age and older. Monday - Tuesday (8am-5pm) Most Wednesdays (8:30-5pm) $30 per visit, cash only  [facility] Adult [facility] Access PRO[facility]  [address], [facility] [phone] Patients are seen by appointment only. Walk-ins are not accepted. [facility] will see patients 70 years of age and older. One Wednesday Evening (Monthly: Volunteer Based).   $30 per visit, cash only  [employer]  [phone] for adults; Children under age 75, call Graduate Pediatric Dentistry at [phone]. Children aged 4-14, please call [phone] to request a pediatric application.  Dental services are provided in all areas of dental care including fillings, crowns and bridges, complete and partial dentures, implants, gum treatment, root canals, and extractions. Preventive care is also provided. Treatment is provided to both adults and children. Patients are selected via a lottery and there is often a waiting list.   [facility] [address], [place]  [phone] [email or web address]   [facility] [address] [place], [place] [phone], Ext. [phone] Second and Fourth Thursday of each month, opens at 6:30 AM; Clinic ends at 9 AM.  Patients are seen on a first-come first-served basis, and a limited number are seen during each clinic.   [facility]  [address] [place], [place] [phone]   Eligibility Requirements You must have lived in [place], [place], or [place] counties for at least the last three months.   You cannot be eligible for state or federal sponsored [place], including [employer], [place], or [employer].   You generally cannot be eligible for healthcare insurance through your employer.    How to apply: Eligibility screenings are held every Tuesday and Wednesday afternoon from 1:00 pm until 4:00 pm. You do not need an appointment for the interview!  [facility] [address] [place], [place] [phone]   [facility] Health Department  [phone]   [facility] Health Department  [phone]   [facility] Health Department  [phone]    Behavioral Health Resources in the Community: Intensive Outpatient Programs Organization         Address  Phone  Notes  [facility] [address], [place], [place] [phone]   [facility] [address], [place], [place] [phone]   [facility]: [facility] Drug S[facility]cs [address], [place], [place]  [phone]   [facility] [address]  [place], [place] [phone] or [phone]   Substance Abuse Resources Organization         Address  Phone  Notes  [facility] Drug Services  [phone]   [facility]  [phone]   [facility]  [phone]   [facility]  [phone]   Residential &  Outpatient Substance Abuse Program  [phone]   Psychological Services Organization         Address  Phone  Notes  [facility]  [phone]- [phone]   [facility]  [phone]- [phone]   [facility] [phone] N. [address], [place] [phone] or [phone]    Mobile Crisis Teams Organization         Address  Phone  Notes  [facility], Mobile Crisis Care Unit  [phone]   [facility]  [address] [place], [place] [phone]   Doristine LocksSharon DeEsch [address] [place] [phone]    Self-Help/Support Groups Organization         Address  Phone             Notes  [facility] - variety of support groups  [phone]- [phone] Call for more information  Narcotics Anonymous (NA), [facility] [address], [place]  2 meetings at this location   Statisticianesidential Treatment Programs Organization         Address  Phone  Notes  [facility] [address],    [place]  [phone]   [facility]  [address], [place] [phone], [place], [place] [phone]   [facility] [address], [place] [phone] Admissions: 8am-3pm M-F  [facility] [address]    [place], [place] [phone]   [facility] [address], [place], [place] [phone]   [facility] [address]  [place], [place] [phone]   [facility] - Intensive Outpatient [address], [place], [place] [phone]   [facility] ([facility] [address]  [place], [place] [phone] or [phone]   [facility] ([facility]) [address] [place], [place] [phone] Accepts Medicaid  [facility] [address]  [place] [phone] Substance Abuse/Addiction Treatment   [facility] Behavioral Health Resources Organization         Address  Phone  Notes  [facility]  [phone]) [phone]   Angie FavaJulie Brannon, PhD [address], [address] [place], [place]   [phone]) [phone] or [phone](336) [phone]   [facility]   [address] [place], [place] [phone](336) [phone]   [facility] [address], [place], [place] [phone](336) [phone] Insurance/Medicaid/sponsorship through [facility]  [facility] [address]                                    [place], [place] [phone](336) [phone] Therapy/tele-psych/case  [facility] [address]   [place], [place] [phone]) [phone]    Dr. Lolly MustacheArfeen  [phone]) [phone]   [facility]  [employer] [facility]. 1) [address], [place] 2) [address], [place] 3)  [address], [place] [phone](336) [phone](336) [phone]  [phone](336) [phone]   [facility] Child Abuse Hotline [phone](336) [phone] or [phone](336) [phone] (After Hours)

## 2014-12-22 ENCOUNTER — Telehealth: Payer: Self-pay

## 2014-12-22 NOTE — Telephone Encounter (Signed)
Pt called the office and stated she had a missed call from [place]. Informed her of her appt with Gerrit Halls, NP for March. Pt is aware

## 2014-12-22 NOTE — Telephone Encounter (Signed)
Called pt and informed her about her appt at [facility]. Pt already knew about the appt.

## 2014-12-22 NOTE — ED Provider Notes (Signed)
CSN: [id number]     Arrival date & time 12/12/14  2130 History   First MD Initiated Contact with Patient 12/12/14 2227     Chief Complaint  Patient presents with  . Eye Pain     (Consider location/radiation/quality/duration/timing/severity/associated sxs/prior Treatment) HPI Comments: Left eye pain today denies injury, contact lens use   Patient is a 34 y.o. female presenting with eye pain. The history is provided by the patient.  Eye Pain This is a new problem. The current episode started today. The problem occurs constantly. The problem has been unchanged. Pertinent negatives include no fever, headaches or nausea. Nothing aggravates the symptoms. She has tried nothing for the symptoms. The treatment provided no relief.    Past Medical History  Diagnosis Date  . Cholestasis of pregnancy   . 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 Asthma   . Complication of anesthesia   . PONV (postoperative nausea and vomiting)   . GERD (gastroesophageal reflux disease)   . Hypertension     sts hasnt had any trouble or been on meds in 2 years.  . Degenerative disc disease   . Chronic back pain    Past Surgical History  Procedure Laterality Date  . Wisdom tooth extraction    . No past surgeries    . Esophagogastroduodenoscopy  02/2014    Dr. Jeani Hawking: (propofol) retained fluid/gastric contents found in gastric fundus and body. Fluid aspiration to clear gastric lumen. Patient developed asthmatic exacerbation during procedure and had to be woke up to receive nebulizer.  . Colonoscopy  02/2014    Dr. Luisa Hart Hung:(propofol) normal  . Cholecystectomy N/A 04/14/2014    Procedure: LAPAROSCOPIC CHOLECYSTECTOMY;  Surgeon: Dalia Heading, MD;  Location: [facility] ORS;  Service: General;  Laterality: N/A;  . Gallbladder surgery    . Bacterial overgrowth test N/A 09/16/2014    Procedure: BACTERIAL OVERGROWTH TEST;  Surgeon: Corbin Ade, MD;  Location: [facility] ENDO SUITE;  Service: Endoscopy;   Laterality: N/A;  0730 - rescheduled to 9/29 - Ginger to notify pt   Family History  Problem Relation Age of Onset  . Arthritis Mother   . Asthma Mother   . Depression Mother   . Hypertension Mother   . Miscarriages / Stillbirths Maternal Aunt   . Stroke Paternal Uncle   . Heart disease Paternal Uncle   . Cancer Maternal Grandfather     kidney  . Heart disease Paternal Grandmother    History  Substance Use Topics  . Smoking status: Former Smoker -- 0.50 packs/day for 5 years    Types: Cigarettes    Quit date: 04/11/2011  . Smokeless tobacco: Never Used     Comment: Quit x 2 years  . Alcohol Use: No   OB History    Gravida Para Term Preterm AB TAB SAB Ectopic Multiple Living   0 1 1 0 0 0 1     Review of Systems  Constitutional: Negative for fever.  Eyes: Positive for pain. Negative for photophobia and visual disturbance.  Gastrointestinal: Negative for nausea.  Neurological: Negative for dizziness and headaches.  All other systems reviewed and are negative.     Allergies  Penicillins  Home Medications   Prior to Admission medications   Medication Sig Start Date End Date Taking? Authorizing Provider  HYDROcodone-acetaminophen (NORCO/VICODIN) 5-325 MG per tablet Take 1 tablet by mouth every 4 (four) hours as needed. 12/12/14   Cipriano Mile  Manus Rudd, NP  predniSONE (DELTASONE) 10 MG tablet Please take 60 mg (6 tablets) daily for one week. On day 8, please take 50 mg (5 tablets) once PO. On day 9, please take 40 mg (4 tablets) PO once. On day 10, please take 30 mg (3 tablets) PO once. On day 11, please take 20 mg (2 tablets) PO once. On day 12, please take 10 mg (1 tablet) PO once. 12/16/14   Marissa Sciacca, PA-C  promethazine (PHENERGAN) 25 MG tablet Take 1 tablet (25 mg total) by mouth every 6 (six) hours as needed for nausea or vomiting. 09/08/14   Nira Retort, NP   BP 121/78 mmHg  Pulse 82  Temp(Src) 98.3 F (36.8 C) (Oral)  Resp 18  Ht  (1.575 m)  Wt 165 lb  (74.844 kg)  BMI 30.17 kg/m2  SpO2 100%  LMP 12/12/2014 Physical Exam  Constitutional: She appears well-developed and well-nourished.  HENT:  Head: Normocephalic.  Eyes: EOM are normal. Pupils are equal, round, and reactive to light.  Slit lamp exam:      The left eye shows no fluorescein uptake and no anterior chamber bulge.  Neck: Normal range of motion.  Cardiovascular: Normal rate.   Neurological: She is alert.  Vitals reviewed.   ED Course  Procedures (including critical care time) Labs Review Labs Reviewed - No data to display  Imaging Review No results found.   EKG Interpretation None      MDM  Eye exam reveals no cornesl FB, abrasion dc home with pain management and opthlo follow up  Final diagnoses:  Eye pain, left         Arman Filter, NP 12/22/14 2039  Mirian Mo, MD 12/23/14 [phone]

## 2014-12-23 ENCOUNTER — Ambulatory Visit: Payer: No Typology Code available for payment source | Admitting: Diagnostic Neuroimaging

## 2014-12-23 NOTE — Progress Notes (Signed)
cc'ed to pcp °

## 2015-01-13 ENCOUNTER — Emergency Department (HOSPITAL_COMMUNITY): Payer: No Typology Code available for payment source

## 2015-01-13 ENCOUNTER — Encounter (HOSPITAL_COMMUNITY): Payer: Self-pay

## 2015-01-13 ENCOUNTER — Emergency Department (HOSPITAL_COMMUNITY)
Admission: EM | Admit: 2015-01-13 | Discharge: 2015-01-13 | Disposition: A | Discharge status: Home / Self Care | Payer: Self-pay | Attending: Emergency Medicine | Admitting: Emergency Medicine

## 2015-01-13 ENCOUNTER — Emergency Department (HOSPITAL_COMMUNITY): Payer: Self-pay

## 2015-01-13 DIAGNOSIS — Z7952 Long term (current) use of systemic steroids: Secondary | ICD-10-CM | POA: Insufficient documentation

## 2015-01-13 DIAGNOSIS — H53141 Visual discomfort, right eye: Secondary | ICD-10-CM | POA: Insufficient documentation

## 2015-01-13 DIAGNOSIS — G8929 Other chronic pain: Secondary | ICD-10-CM | POA: Insufficient documentation

## 2015-01-13 DIAGNOSIS — I10 Essential (primary) hypertension: Secondary | ICD-10-CM | POA: Insufficient documentation

## 2015-01-13 DIAGNOSIS — Z880 Allergy status to penicillin: Secondary | ICD-10-CM | POA: Insufficient documentation

## 2015-01-13 DIAGNOSIS — Z8739 Personal history of other diseases of the musculoskeletal system and connective tissue: Secondary | ICD-10-CM | POA: Insufficient documentation

## 2015-01-13 DIAGNOSIS — Z79899 Other long term (current) drug therapy: Secondary | ICD-10-CM | POA: Insufficient documentation

## 2015-01-13 DIAGNOSIS — Z87891 Personal history of nicotine dependence: Secondary | ICD-10-CM | POA: Insufficient documentation

## 2015-01-13 DIAGNOSIS — H5711 Ocular pain, right eye: Secondary | ICD-10-CM | POA: Insufficient documentation

## 2015-01-13 DIAGNOSIS — R51 Headache: Secondary | ICD-10-CM | POA: Insufficient documentation

## 2015-01-13 DIAGNOSIS — J45909 Unspecified asthma, uncomplicated: Secondary | ICD-10-CM | POA: Insufficient documentation

## 2015-01-13 DIAGNOSIS — Z8619 Personal history of other infectious and parasitic diseases: Secondary | ICD-10-CM | POA: Insufficient documentation

## 2015-01-13 DIAGNOSIS — H02401 Unspecified ptosis of right eyelid: Secondary | ICD-10-CM | POA: Insufficient documentation

## 2015-01-13 LAB — CBC WITH DIFFERENTIAL/PLATELET
Basophils Absolute: 0 10*3/uL (ref 0.0–0.1)
Basophils Relative: 0 % (ref 0–1)
EOS ABS: 0.4 10*3/uL (ref 0.0–0.7)
Eosinophils Relative: 5 % (ref 0–5)
HEMATOCRIT: 40.4 % (ref 36.0–46.0)
HEMOGLOBIN: 13.7 g/dL (ref 12.0–15.0)
LYMPHS PCT: 40 % (ref 12–46)
Lymphs Abs: 3.1 10*3/uL (ref 0.7–4.0)
MCH: 29.3 pg (ref 26.0–34.0)
MCHC: 33.9 g/dL (ref 30.0–36.0)
MCV: 86.5 fL (ref 78.0–100.0)
Monocytes Absolute: 0.4 10*3/uL (ref 0.1–1.0)
Monocytes Relative: 6 % (ref 3–12)
NEUTROS ABS: 3.7 10*3/uL (ref 1.7–7.7)
Neutrophils Relative %: 49 % (ref 43–77)
Platelets: 224 10*3/uL (ref 150–400)
RBC: 4.67 MIL/uL (ref 3.87–5.11)
RDW: 13.3 % (ref 11.5–15.5)
WBC: 7.7 10*3/uL (ref 4.0–10.5)

## 2015-01-13 LAB — BASIC METABOLIC PANEL
Anion gap: 7 (ref 5–15)
BUN: 12 mg/dL (ref 6–23)
CO2: 25 mmol/L (ref 19–32)
CREATININE: 0.84 mg/dL (ref 0.50–1.10)
Calcium: 8.9 mg/dL (ref 8.4–10.5)
Chloride: 107 mmol/L (ref 96–112)
GFR calc non Af Amer: >90 mL/min (ref >90)
GLUCOSE: 96 mg/dL (ref 70–99)
Potassium: 3.9 mmol/L (ref 3.5–5.1)
SODIUM: 139 mmol/L (ref 135–145)

## 2015-01-13 MED ORDER — SODIUM CHLORIDE 0.9 % IV BOLUS (SEPSIS)
1000.0000 mL | INTRAVENOUS | Status: AC
  Administered 2015-01-13: 1000 mL via INTRAVENOUS

## 2015-01-13 MED ORDER — GADOBENATE DIMEGLUMINE 529 MG/ML IV SOLN
15.0000 mL | Freq: Once | INTRAVENOUS | Status: AC | PRN
  Administered 2015-01-13: 15 mL via INTRAVENOUS

## 2015-01-13 MED ORDER — DIPHENHYDRAMINE HCL 50 MG/ML IJ SOLN
25.0000 mg | Freq: Once | INTRAMUSCULAR | Status: AC
Start: 2015-01-13 — End: 2015-01-13
  Administered 2015-01-13: 25 mg via INTRAVENOUS
  Filled 2015-01-13: qty 1

## 2015-01-13 MED ORDER — METOCLOPRAMIDE HCL 5 MG/ML IJ SOLN
10.0000 mg | Freq: Once | INTRAMUSCULAR | Status: AC
  Administered 2015-01-13: 10 mg via INTRAVENOUS
  Filled 2015-01-13: qty 2

## 2015-01-13 MED ORDER — LORAZEPAM 2 MG/ML IJ SOLN
0.5000 mg | INTRAMUSCULAR | Status: DC | PRN
  Administered 2015-01-13: 0.5 mg via INTRAVENOUS
  Filled 2015-01-13: qty 1

## 2015-01-13 MED ORDER — HYDROCODONE-ACETAMINOPHEN 5-325 MG PO TABS: 1.0000 | ORAL_TABLET | Freq: Four times a day (QID) | ORAL | Status: DC | PRN

## 2015-01-13 NOTE — ED Provider Notes (Signed)
CSN: [id number]     Arrival date & time 01/13/15  1439 History   First MD Initiated Contact with Patient 01/13/15 1838     Chief Complaint  Patient presents with  . Eye Pain     (Consider location/radiation/quality/duration/timing/severity/associated sxs/prior Treatment) Patient is a 34 y.o. female presenting with eye pain. The history is provided by the patient.  Eye Pain This is a recurrent problem. The current episode started 2 days ago. The problem occurs constantly. The problem has not changed since onset.Associated symptoms include headaches. Pertinent negatives include no chest pain, no abdominal pain and no shortness of breath. Exacerbated by: eye movement. Nothing relieves the symptoms. She has tried nothing for the symptoms. The treatment provided no relief.    Past Medical History  Diagnosis Date  . Cholestasis of pregnancy   . 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 Asthma   . Complication of anesthesia   . PONV (postoperative nausea and vomiting)   . GERD (gastroesophageal reflux disease)   . Hypertension     sts hasnt had any trouble or been on meds in 2 years.  . Degenerative disc disease   . Chronic back pain    Past Surgical History  Procedure Laterality Date  . Wisdom tooth extraction    . No past surgeries    . Esophagogastroduodenoscopy  02/2014    Dr. Jeani Hawking: (propofol) retained fluid/gastric contents found in gastric fundus and body. Fluid aspiration to clear gastric lumen. Patient developed asthmatic exacerbation during procedure and had to be woke up to receive nebulizer.  . Colonoscopy  02/2014    Dr. Luisa Hart Hung:(propofol) normal  . Cholecystectomy N/A 04/14/2014    Procedure: LAPAROSCOPIC CHOLECYSTECTOMY;  Surgeon: Dalia Heading, MD;  Location: [facility];  Service: General;  Laterality: N/A;  . Gallbladder surgery    . Bacterial overgrowth test N/A 09/16/2014    Procedure: BACTERIAL OVERGROWTH TEST;  Surgeon: Corbin Ade, MD;   Location: [facility];  Service: Endoscopy;  Laterality: N/A;  0730 - rescheduled to 9/29 - Ginger to notify pt   Family History  Problem Relation Age of Onset  . Arthritis Mother   . Asthma Mother   . Depression Mother   . Hypertension Mother   . Miscarriages / Stillbirths Maternal Aunt   . Stroke Paternal Uncle   . Heart disease Paternal Uncle   . Cancer Maternal Grandfather     kidney  . Heart disease Paternal Grandmother    History  Substance Use Topics  . Smoking status: Former Smoker -- 0.50 packs/day for 5 years    Types: Cigarettes    Quit date: 04/11/2011  . Smokeless tobacco: Never Used     Comment: Quit x 2 years  . Alcohol Use: No   OB History    Gravida Para Term Preterm AB TAB SAB Ectopic Multiple Living   0 1 1 0 0 0 1     Review of Systems  Constitutional: Negative for fever and fatigue.  HENT: Negative for congestion and drooling.   Eyes: Positive for photophobia and pain.  Respiratory: Negative for cough and shortness of breath.   Cardiovascular: Negative for chest pain.  Gastrointestinal: Negative for nausea, vomiting, abdominal pain and diarrhea.  Genitourinary: Negative for dysuria and hematuria.  Musculoskeletal: Negative for back pain, gait problem and neck pain.  Skin: Negative for color change.  Neurological: Positive for headaches. Negative for dizziness.  Hematological:  Negative for adenopathy.  Psychiatric/Behavioral: Negative for behavioral problems.  All other systems reviewed and are negative.     Allergies  Penicillins  Home Medications   Prior to Admission medications   Medication Sig Start Date End Date Taking? Authorizing Provider  HYDROcodone-acetaminophen (NORCO/VICODIN) 5-325 MG per tablet Take 1 tablet by mouth every 4 (four) hours as needed. 12/12/14   Arman FilterGail K Schulz, NP  predniSONE (DELTASONE) 10 MG tablet Please take 60 mg (6 tablets) daily for one week. On day 8, please take 50 mg (5 tablets) once PO. On day 9,  please take 40 mg (4 tablets) PO once. On day 10, please take 30 mg (3 tablets) PO once. On day 11, please take 20 mg (2 tablets) PO once. On day 12, please take 10 mg (1 tablet) PO once. 12/16/14   Marissa Sciacca, PA-C  promethazine (PHENERGAN) 25 MG tablet Take 1 tablet (25 mg total) by mouth every 6 (six) hours as needed for nausea or vomiting. 09/08/14   Nira RetortAnna W Sams, NP   BP 118/82 mmHg  Pulse 90  Temp(Src) 98.6 F (37 C) (Oral)  Resp 16  Ht 5\' 2"  (1.575 m)  Wt 165 lb (74.844 kg)  BMI 30.17 kg/m2  SpO2 95%  LMP 01/13/2015 Physical Exam  Constitutional: She is oriented to person, place, and time. She appears well-developed and well-nourished.  HENT:  Head: Normocephalic.  Mouth/Throat: Oropharynx is clear and moist. No oropharyngeal exudate.  Eyes: Conjunctivae and EOM are normal. Pupils are equal, round, and reactive to light.  Mild right upper eyelid swelling and ptosis.  20/15 vision bilaterally.  Neck: Normal range of motion. Neck supple.  Cardiovascular: Normal rate, regular rhythm, normal heart sounds and intact distal pulses.  Exam reveals no gallop and no friction rub.   No murmur heard. Pulmonary/Chest: Effort normal and breath sounds normal. No respiratory distress. She has no wheezes.  Abdominal: Soft. Bowel sounds are normal. There is no tenderness. There is no rebound and no guarding.  Musculoskeletal: Normal range of motion. She exhibits no edema or tenderness.  Neurological: She is alert and oriented to person, place, and time.  alert, oriented x3 speech: normal in context and clarity memory: intact grossly cranial nerves II-XII: intact motor strength: full proximally and distally no involuntary movements or tremors sensation: intact to light touch diffusely  cerebellar: finger-to-nose and heel-to-shin intact gait: normal forwards and backwards  Skin: Skin is warm and dry.  Psychiatric: She has a normal mood and affect. Her behavior is normal.  Nursing note  and vitals reviewed.   ED Course  Procedures (including critical care time) Labs Review Labs Reviewed  CBC WITH DIFFERENTIAL/PLATELET  BASIC METABOLIC PANEL    Imaging Review Mr Laqueta JeanBrain W Contrast  01/13/2015   CLINICAL DATA:  Acute RIGHT eye pain. Two days of RIGHT IA swelling, light sensitivity and pain. Assess for optic neuritis, orbital disease and demyelination. Similar symptoms in LEFT eye 1 month ago, treated with steroids.  EXAM: MRI HEAD WITHOUT AND WITH CONTRAST  MRI ORBITS WITHOUT AND WITH CONTRAST  TECHNIQUE: Multiplanar, multiecho pulse sequences of the brain and surrounding structures, and cervical spine, to include the craniocervical junction and cervicothoracic junction, were obtained without and with intravenous contrast.  CONTRAST:  15mL MULTIHANCE GADOBENATE DIMEGLUMINE 529 MG/ML IV SOLN  COMPARISON:  MRI of the orbits December 15, 2014  FINDINGS: MRI HEAD FINDINGS  The ventricles and sulci are normal for patient's age. No abnormal parenchymal signal, mass lesions, mass effect.  No abnormal parenchymal enhancement. No reduced diffusion to suggest acute ischemia. No susceptibility artifact to suggest hemorrhage.  No abnormal extra-axial fluid collections. No extra-axial masses nor leptomeningeal enhancement. Normal major intracranial vascular flow voids seen at the skull base.  No suspicious calvarial bone marrow signal. No abnormal sellar expansion. Craniocervical junction maintained.  MRI ORBITS FINDINGS  Ocular globes intact, lenses are located. Normal appearance of the optic nerves, chiasm and radiations. No abnormal ocular enhancement with particular attention to the optic nerves. Preservation of the orbital fat appear normal appearance of the extra-ocular muscles. Superior ophthalmic veins are not enlarged. Symmetric normal appearance of the cavernous sinus.  Mild paranasal sinus mucosal thickening without air-fluid levels. Mastoid air cells are well aerated. No suspicious calvarial  bone marrow signal.  IMPRESSION: MRI HEAD:  Normal MRI of the brain with and without contrast.  MRI ORBITS:  Normal MRI of the orbits with and without contrast.  Mild paranasal sinusitis.   Electronically Signed   By: Awilda Metro   On: 01/13/2015 21:07   Mr Orbits W/ccm  01/13/2015   CLINICAL DATA:  Acute RIGHT eye pain. Two days of RIGHT IA swelling, light sensitivity and pain. Assess for optic neuritis, orbital disease and demyelination. Similar symptoms in LEFT eye 1 month ago, treated with steroids.  EXAM: MRI HEAD WITHOUT AND WITH CONTRAST  MRI ORBITS WITHOUT AND WITH CONTRAST  TECHNIQUE: Multiplanar, multiecho pulse sequences of the brain and surrounding structures, and cervical spine, to include the craniocervical junction and cervicothoracic junction, were obtained without and with intravenous contrast.  CONTRAST:  15mL MULTIHANCE GADOBENATE DIMEGLUMINE 529 MG/ML IV SOLN  COMPARISON:  MRI of the orbits December 15, 2014  FINDINGS: MRI HEAD FINDINGS  The ventricles and sulci are normal for patient's age. No abnormal parenchymal signal, mass lesions, mass effect. No abnormal parenchymal enhancement. No reduced diffusion to suggest acute ischemia. No susceptibility artifact to suggest hemorrhage.  No abnormal extra-axial fluid collections. No extra-axial masses nor leptomeningeal enhancement. Normal major intracranial vascular flow voids seen at the skull base.  No suspicious calvarial bone marrow signal. No abnormal sellar expansion. Craniocervical junction maintained.  MRI ORBITS FINDINGS  Ocular globes intact, lenses are located. Normal appearance of the optic nerves, chiasm and radiations. No abnormal ocular enhancement with particular attention to the optic nerves. Preservation of the orbital fat appear normal appearance of the extra-ocular muscles. Superior ophthalmic veins are not enlarged. Symmetric normal appearance of the cavernous sinus.  Mild paranasal sinus mucosal thickening without  air-fluid levels. Mastoid air cells are well aerated. No suspicious calvarial bone marrow signal.  IMPRESSION: MRI HEAD:  Normal MRI of the brain with and without contrast.  MRI ORBITS:  Normal MRI of the orbits with and without contrast.  Mild paranasal sinusitis.   Electronically Signed   By: Awilda Metro   On: 01/13/2015 21:07     EKG Interpretation None      MDM   Final diagnoses:  Acute right eye pain    7:15 PM 34 y.o. female who presents with right eye pain which began 2 days ago. She also has associated frontal headache which is 8 out of 10 currently. It was gradual in onset. She denies any head injuries or fever. Symptoms similar to previous presentation at the end of December 2015. She had an MRI of her orbits here which showed optic neuritis. She followed up with Dr. Karleen Hampshire with ophthalmology. She notes that her symptoms have resolved in the left eye. 2 days  ago she began having similar symptoms in the right eye including photophobia, pain with eye movement, and headache. She denies any visual changes. She is afebrile and vital signs are unremarkable here. She was seen by her optometrist and also her ophthalmologist today. She was noted to have some mild right upper eyelid swelling and ptosis but otherwise had normal pressures and a normal exam. She was sent here for an MRI to rule out optic neuritis/orbital disease/demyelinating disease.  10:07 PM: I interpreted/reviewed the labs and/or imaging which were non-contributory.  HA improved. Will provide some more pain medicine for pain control at home. I have discussed the diagnosis/risks/treatment options with the patient and believe the pt to be eligible for discharge home to follow-up with Dr. Karleen Hampshire (optho) tomorrow. We also discussed returning to the ED immediately if new or worsening sx occur. We discussed the sx which are most concerning (e.g., worsening pain, fever, redness) that necessitate immediate return. Medications  administered to the patient during their visit and any new prescriptions provided to the patient are listed below.  Medications given during this visit Medications  LORazepam (ATIVAN) injection 0.5 mg (0.5 mg Intravenous Given 01/13/15 1923)  sodium chloride 0.9 % bolus 1,000 mL (0 mLs Intravenous Stopped 01/13/15 2103)  metoCLOPramide (REGLAN) injection 10 mg (10 mg Intravenous Given 01/13/15 1923)  diphenhydrAMINE (BENADRYL) injection 25 mg (25 mg Intravenous Given 01/13/15 1923)  gadobenate dimeglumine (MULTIHANCE) injection 15 mL (15 mLs Intravenous Contrast Given 01/13/15 2004)    New Prescriptions   No medications on file     Purvis Sheffield, MD 01/13/15 2210

## 2015-01-13 NOTE — ED Notes (Signed)
[facility] head and orbits with contrast r/o optic neuritis r/o orbital dz r/o demyelinating dz.  Onset 2 days ago right eye redness, swelling, light sensitivity, pain when moving eye, sees blue right when looks far and headache.  No blurred vision.

## 2015-01-13 NOTE — ED Notes (Signed)
Patient transported to MRI 

## 2015-01-13 NOTE — ED Notes (Signed)
Pt also reports that she had same symptoms in left eye in December and was treated with prednisone.

## 2015-01-13 NOTE — Discharge Instructions (Signed)
General Headache Without Cause  A general headache is pain or discomfort felt around the head or neck area. The cause may not be found.   HOME CARE   · Keep all doctor visits.  · Only take medicines as told by your doctor.  · Lie down in a dark, quiet room when you have a headache.  · Keep a journal to find out if certain things bring on headaches. For example, write down:  ¨ What you eat and drink.  ¨ How much sleep you get.  ¨ Any change to your diet or medicines.  · Relax by getting a massage or doing other relaxing activities.  · Put ice or heat packs on the head and neck area as told by your doctor.  · Lessen stress.  · Sit up straight. Do not tighten (tense) your muscles.  · Quit smoking if you smoke.  · Lessen how much alcohol you drink.  · Lessen how much caffeine you drink, or stop drinking caffeine.  · Eat and sleep on a regular schedule.  · Get 7 to 9 hours of sleep, or as told by your doctor.  · Keep lights dim if bright lights bother you or make your headaches worse.  GET HELP RIGHT AWAY IF:   · Your headache becomes really bad.  · You have a fever.  · You have a stiff neck.  · You have trouble seeing.  · Your muscles are weak, or you lose muscle control.  · You lose your balance or have trouble walking.  · You feel like you will pass out (faint), or you pass out.  · You have really bad symptoms that are different than your first symptoms.  · You have problems with the medicines given to you by your doctor.  · Your medicines do not work.  · Your headache feels different than the other headaches.  · You feel sick to your stomach (nauseous) or throw up (vomit).  MAKE SURE YOU:   · Understand these instructions.  · Will watch your condition.  · Will get help right away if you are not doing well or get worse.  Document Released: 09/13/2008 Document Revised: 02/27/2012 Document Reviewed: 11/25/2011  [employer]® Patient Information ©2015 [employer]. This information is not intended to replace advice given to  you by your health care provider. Make sure you discuss any questions you have with your health care provider.

## 2015-03-05 ENCOUNTER — Ambulatory Visit: Payer: Self-pay | Admitting: Gastroenterology

## 2015-03-05 ENCOUNTER — Encounter: Payer: Self-pay | Admitting: Gastroenterology

## 2015-03-20 IMAGING — CR DG HAND COMPLETE 3+V*L*
3 series · 3 of 3 positions shown · non-contrast
Comparison: None.

CLINICAL DATA: HAND PAIN. Left hand swelling and pain over the
anterior third and fourth metacarpophalangeal joints.

EXAM:
LEFT HAND - COMPLETE 3+ VIEW

[x hand pa left]
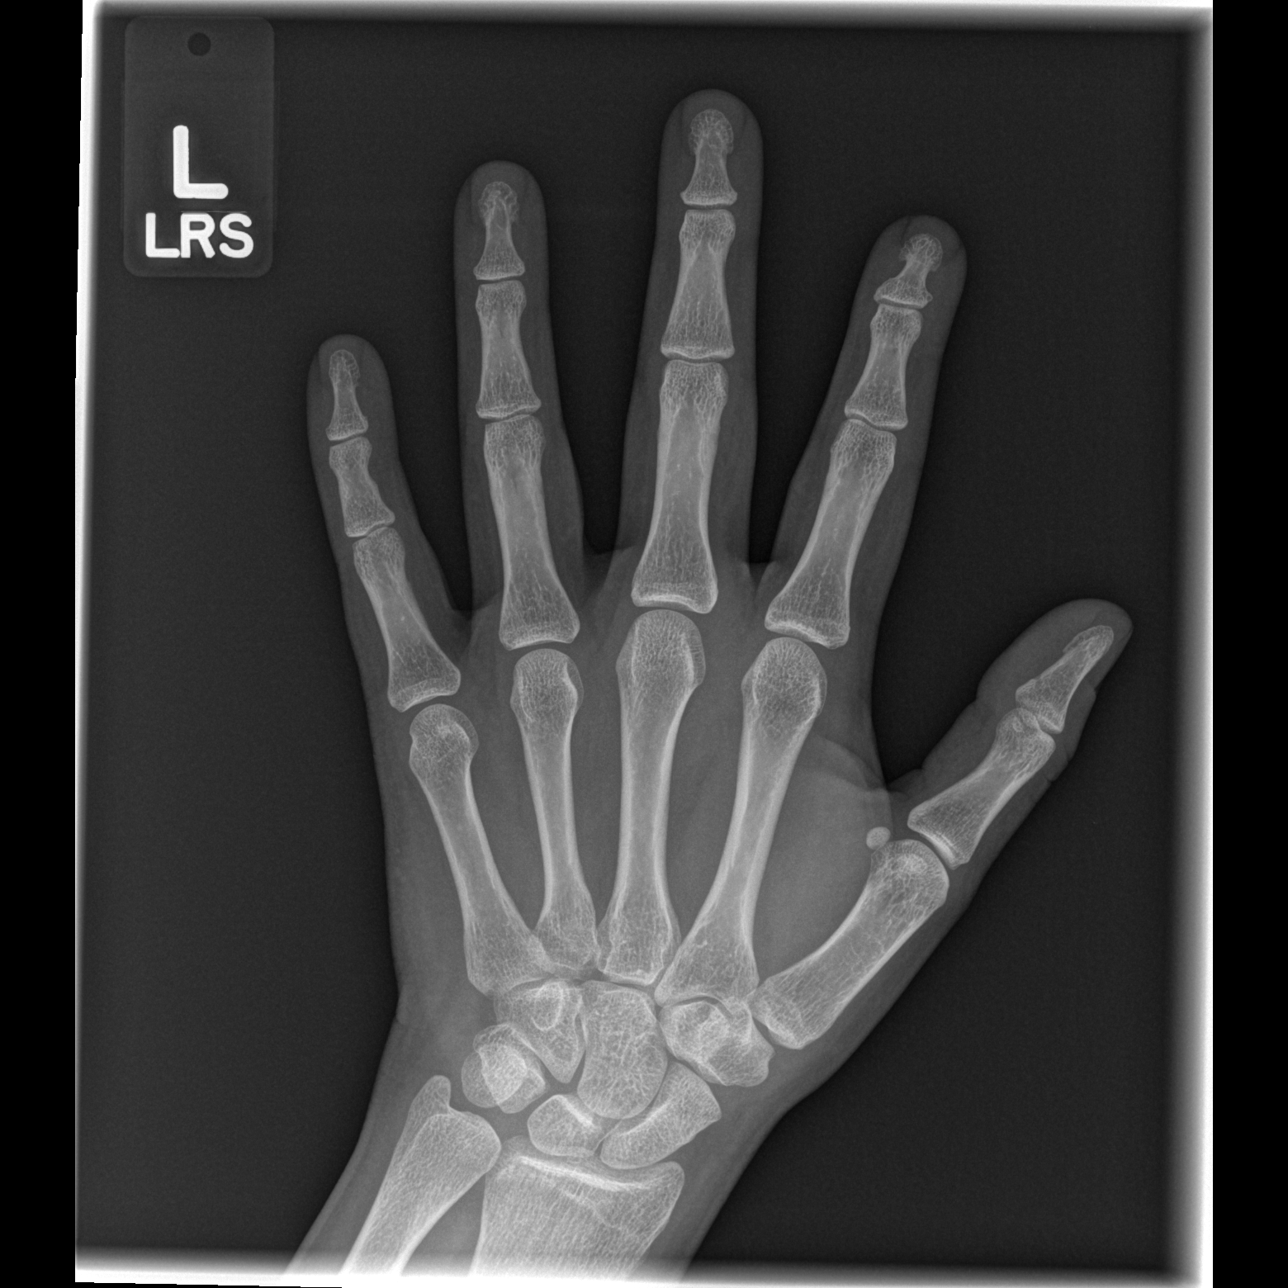

[x hand oblique left]
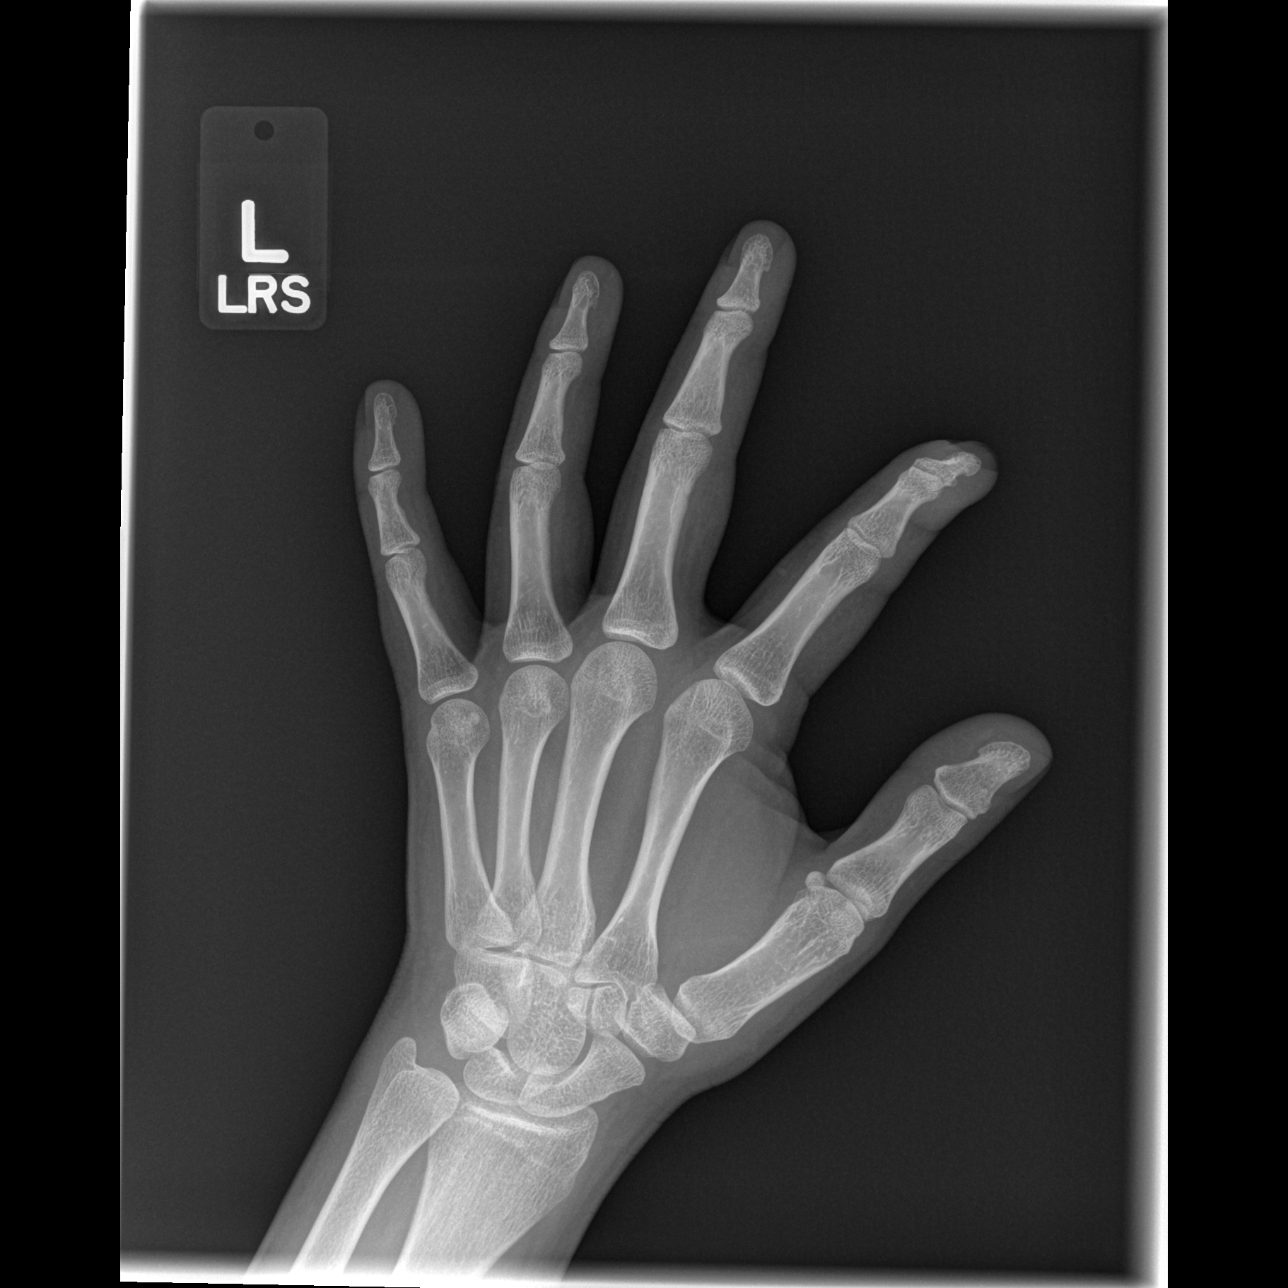

[x hand lat left]
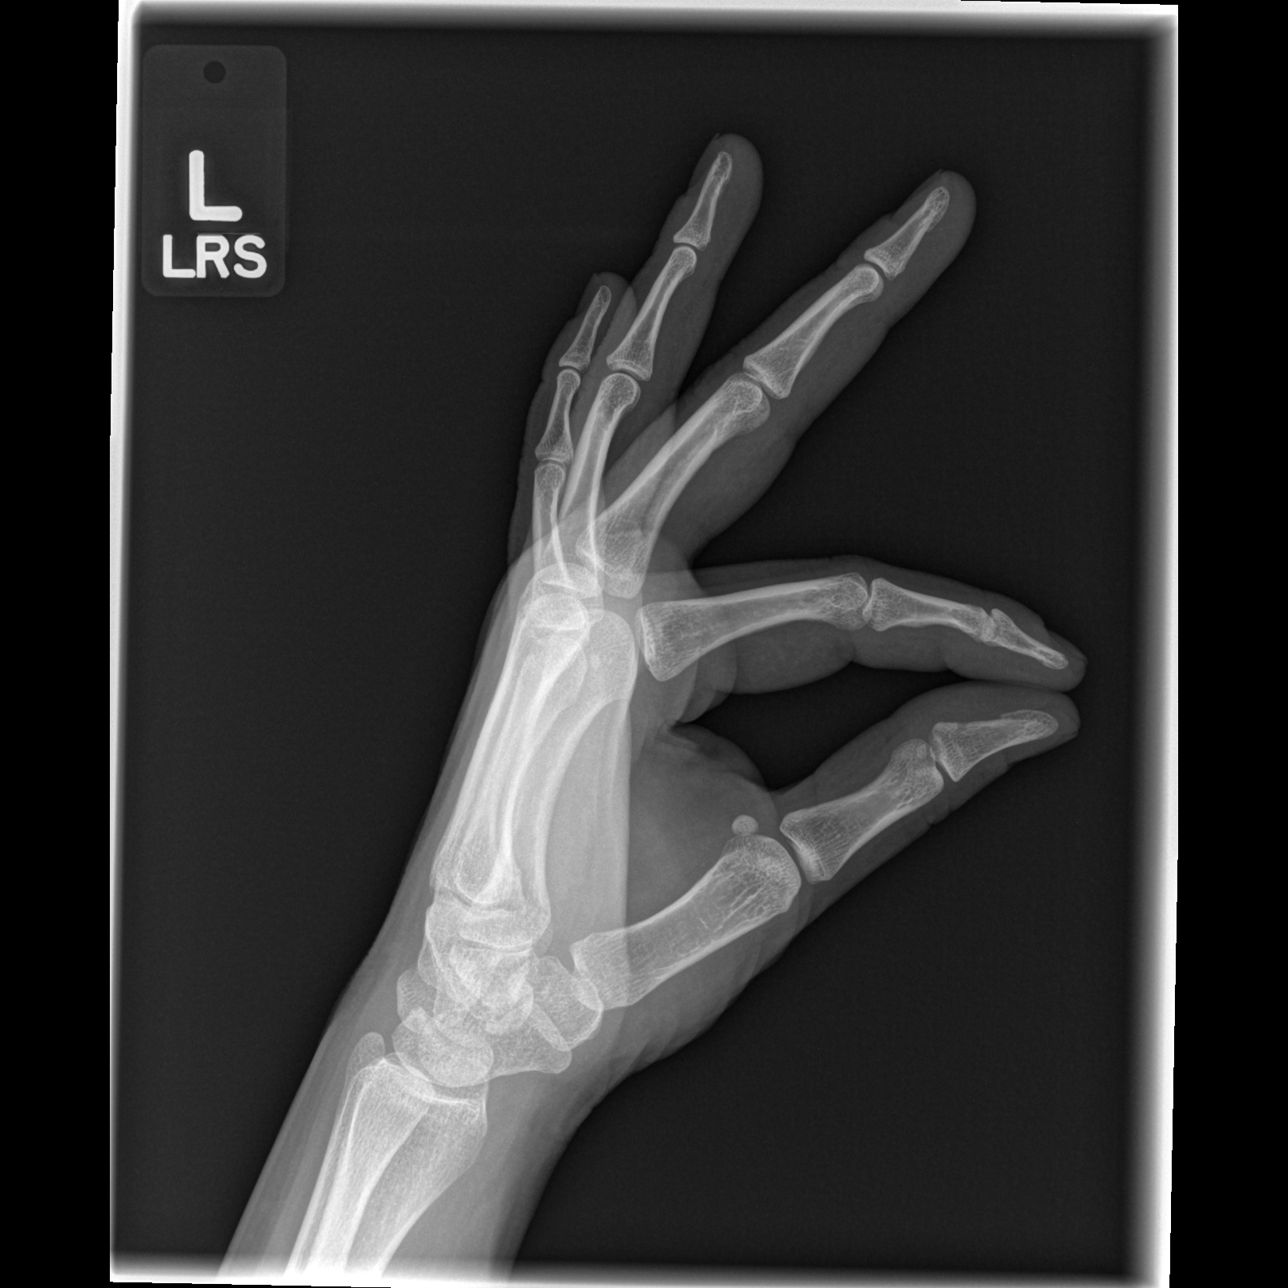

[3 of 3 positions shown; findings below may reference images not displayed]

FINDINGS: No displaced fracture or dislocation. No aggressive osseous lesion
or overt degenerative change.
IMPRESSION: No acute or aggressive osseous finding of the left hand.

## 2015-03-25 ENCOUNTER — Emergency Department (HOSPITAL_BASED_OUTPATIENT_CLINIC_OR_DEPARTMENT_OTHER)
Admission: EM | Admit: 2015-03-25 | Discharge: 2015-03-25 | Disposition: A | Discharge status: Home / Self Care | Payer: 59 | Attending: Emergency Medicine | Admitting: Emergency Medicine

## 2015-03-25 ENCOUNTER — Encounter (HOSPITAL_BASED_OUTPATIENT_CLINIC_OR_DEPARTMENT_OTHER): Payer: Self-pay | Admitting: Emergency Medicine

## 2015-03-25 DIAGNOSIS — G8929 Other chronic pain: Secondary | ICD-10-CM | POA: Diagnosis not present

## 2015-03-25 DIAGNOSIS — Z880 Allergy status to penicillin: Secondary | ICD-10-CM | POA: Diagnosis not present

## 2015-03-25 DIAGNOSIS — Z720 Tobacco use: Secondary | ICD-10-CM | POA: Diagnosis not present

## 2015-03-25 DIAGNOSIS — M79601 Pain in right arm: Secondary | ICD-10-CM | POA: Diagnosis present

## 2015-03-25 DIAGNOSIS — Z8719 Personal history of other diseases of the digestive system: Secondary | ICD-10-CM | POA: Diagnosis not present

## 2015-03-25 DIAGNOSIS — I10 Essential (primary) hypertension: Secondary | ICD-10-CM | POA: Diagnosis not present

## 2015-03-25 DIAGNOSIS — G5601 Carpal tunnel syndrome, right upper limb: Secondary | ICD-10-CM | POA: Diagnosis not present

## 2015-03-25 DIAGNOSIS — Z8739 Personal history of other diseases of the musculoskeletal system and connective tissue: Secondary | ICD-10-CM | POA: Diagnosis not present

## 2015-03-25 DIAGNOSIS — Z8619 Personal history of other infectious and parasitic diseases: Secondary | ICD-10-CM | POA: Insufficient documentation

## 2015-03-25 DIAGNOSIS — J45909 Unspecified asthma, uncomplicated: Secondary | ICD-10-CM | POA: Diagnosis not present

## 2015-03-25 MED ORDER — OXYCODONE-ACETAMINOPHEN 5-325 MG PO TABS
1.0000 | ORAL_TABLET | Freq: Once | ORAL | Status: AC
  Administered 2015-03-25: 1 via ORAL

## 2015-03-25 MED ORDER — IBUPROFEN 800 MG PO TABS
800.0000 mg | ORAL_TABLET | Freq: Once | ORAL | Status: AC
  Administered 2015-03-25: 800 mg via ORAL

## 2015-03-25 MED ORDER — NAPROXEN 500 MG PO TABS: 500.0000 mg | ORAL_TABLET | Freq: Two times a day (BID) | ORAL | Status: DC

## 2015-03-25 MED ORDER — OXYCODONE-ACETAMINOPHEN 5-325 MG PO TABS
ORAL_TABLET | ORAL | Status: DC
Start: 2015-03-25 — End: 2015-03-25
  Filled 2015-03-25: qty 1

## 2015-03-25 MED ORDER — OXYCODONE-ACETAMINOPHEN 5-325 MG PO TABS: 1.0000 | ORAL_TABLET | ORAL | Status: DC | PRN

## 2015-03-25 MED ORDER — IBUPROFEN 800 MG PO TABS
ORAL_TABLET | ORAL | Status: AC
  Filled 2015-03-25: qty 1

## 2015-03-25 NOTE — ED Provider Notes (Signed)
CSN: [id number]     Arrival date & time 03/25/15  0214 History   First MD Initiated Contact with Patient 03/25/15 0405     Chief Complaint  Patient presents with  . Arm Pain     (Consider location/radiation/quality/duration/timing/severity/associated sxs/prior Treatment) Patient is a 34 y.o. female presenting with arm pain. The history is provided by the patient.  Arm Pain  She pain throughout her right forearm for the last month. Pain is getting worse per her she currently rates it at 8/10. It is worse with pronation/supination. Nothing makes it feel any better. She is tried massage treatments without any benefit. She has applied ice and heat without any benefit. 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 She also notices numbness in the arm. She frequently wakes up at night with her arm feeling numb. She denies any trauma.  Past Medical History  Diagnosis Date  . Cholestasis of pregnancy   . 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 Asthma   . Complication of anesthesia   . PONV (postoperative nausea and vomiting)   . GERD (gastroesophageal reflux disease)   . Hypertension     sts hasnt had any trouble or been on meds in 2 years.  . Degenerative disc disease   . Chronic back pain    Past Surgical History  Procedure Laterality Date  . Wisdom tooth extraction    . No past surgeries    . Esophagogastroduodenoscopy  02/2014    Dr. Jeani HawkingPatrick Hung: (propofol) retained fluid/gastric contents found in gastric fundus and body. Fluid aspiration to clear gastric lumen. Patient developed asthmatic exacerbation during procedure and had to be woke up to receive nebulizer.  . Colonoscopy  02/2014    Dr. Luisa HartPatrick Hung:(propofol) normal  . Cholecystectomy N/A 04/14/2014    Procedure: LAPAROSCOPIC CHOLECYSTECTOMY;  Surgeon: Dalia HeadingMark A Jenkins, MD;  Location: [facility];  Service: General;  Laterality: N/A;  . Gallbladder surgery    . Bacterial  overgrowth test N/A 09/16/2014    Procedure: BACTERIAL OVERGROWTH TEST;  Surgeon: Corbin Adeobert M Rourk, MD;  Location: [facility];  Service: Endoscopy;  Laterality: N/A;  0730 - rescheduled to 9/29 - Ginger to notify pt   Family History  Problem Relation Age of Onset  . Arthritis Mother   . Asthma Mother   . Depression Mother   . Hypertension Mother   . Miscarriages / Stillbirths Maternal Aunt   . Stroke Paternal Uncle   . Heart disease Paternal Uncle   . Cancer Maternal Grandfather     kidney  . Heart disease Paternal Grandmother    History  Substance Use Topics  . Smoking status: Current Some Day Smoker -- 0.50 packs/day for 5 years    Types: Cigarettes    Last Attempt to Quit: 04/11/2011  . Smokeless tobacco: Never Used     Comment: Quit x 2 years  . Alcohol Use: No   OB History    Gravida Para Term Preterm AB TAB SAB Ectopic Multiple Living   2 1 1  0 1 1 0 0 0 1     Review of Systems  All other systems reviewed and are negative.     Allergies  Penicillins  Home Medications   Prior to Admission medications   Medication Sig Start Date End Date Taking? Authorizing Provider  HYDROcodone-acetaminophen (NORCO) 5-325 MG per tablet Take 1-2 tablets by mouth every 6 (six)  hours as needed. 01/13/15   Purvis Sheffield, MD  predniSONE (DELTASONE) 10 MG tablet Please take 60 mg (6 tablets) daily for one week. On day 8, please take 50 mg (5 tablets) once PO. On day 9, please take 40 mg (4 tablets) PO once. On day 10, please take 30 mg (3 tablets) PO once. On day 11, please take 20 mg (2 tablets) PO once. On day 12, please take 10 mg (1 tablet) PO once. Patient not taking: Reported on 01/13/2015 12/16/14   Marissa Sciacca, PA-C  promethazine (PHENERGAN) 25 MG tablet Take 1 tablet (25 mg total) by mouth every 6 (six) hours as needed for nausea or vomiting. Patient not taking: Reported on 01/13/2015 09/08/14   Nira Retort, NP   BP 122/77 mmHg  Pulse 84  Temp(Src) 98.1 F (36.7 C)  (Oral)  Resp 20  Ht 5' 2.5" (1.588 m)  Wt 165 lb (74.844 kg)  BMI 29.68 kg/m2  SpO2 99%  LMP 03/18/2015 (Exact Date) Physical Exam  Nursing note and vitals reviewed. 34 year old female, resting comfortably and in no acute distress. Vital signs are normal. Oxygen saturation is 99%, which is normal. Head is normocephalic and atraumatic. PERRLA, EOMI. Oropharynx is clear. Neck is nontender and supple without adenopathy or JVD. Back is nontender and there is no CVA tenderness. Lungs are clear without rales, wheezes, or rhonchi. Chest is nontender. Heart has regular rate and rhythm without murmur. Abdomen is soft, flat, nontender without masses or hepatosplenomegaly and peristalsis is normoactive. Extremities have no cyanosis or edema, full range of motion is present. No thenar wasting is seen, but there is slight weakness of pincer grasp on the right compared with left. She is right-handed. Negative Tinel's sign. Skin is warm and dry without rash. Neurologic: Mental status is normal, cranial nerves are intact. There is slight decreased pinprick sensation over the right thumb, second, third fingers on the flexor side. There are no other motor or sensory deficits.  ED Course  Procedures (including critical care time)   MDM   Final diagnoses:  Carpal tunnel syndrome of right wrist    Signs and symptoms strongly suggestive of carpal tunnel syndrome on the right. She is placed in a cock up wrist splint which she is to wear at all times except when bathing. She states that she cannot wear well working as a Teacher, adult education, but I have advised her to wear it at all times that it is feasible. She is referred to hand surgery for follow-up. Prescriptions are given for naproxen and oxycodone-acetaminophen.    Dione Booze, MD 03/25/15 [id number]

## 2015-03-25 NOTE — ED Notes (Signed)
Right wrist and arm pain x1 month.  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

## 2015-03-25 NOTE — Discharge Instructions (Signed)
Wear your brace at all times.  Carpal Tunnel Syndrome The carpal tunnel is a narrow area located on the palm side of your wrist. The tunnel is formed by the wrist bones and ligaments. Nerves, blood vessels, and tendons pass through the carpal tunnel. Repeated wrist motion or certain diseases may cause swelling within the tunnel. This swelling pinches the main nerve in the wrist (median nerve) and causes the painful hand and arm condition called carpal tunnel syndrome. CAUSES   Repeated wrist motions.  Wrist injuries.  Certain diseases like arthritis, diabetes, alcoholism, hyperthyroidism, and kidney failure.  Obesity.  Pregnancy. SYMPTOMS   A "pins and needles" feeling in your fingers or hand, especially in your thumb, index and middle fingers.  Tingling or numbness in your fingers or hand.  An aching feeling in your entire arm, especially when your wrist and elbow are bent for long periods of time.  Wrist pain that goes up your arm to your shoulder.  Pain that goes down into your palm or fingers.  A weak feeling in your hands. DIAGNOSIS  Your health care provider will take your history and perform a physical exam. An electromyography test may be needed. This test measures electrical signals sent out by your nerves into the muscles. The electrical signals are usually slowed by carpal tunnel syndrome. You may also need X-rays. TREATMENT  Carpal tunnel syndrome may clear up by itself. Your health care provider may recommend a wrist splint or medicine such as a nonsteroidal anti-inflammatory medicine. Cortisone injections may help. Sometimes, surgery may be needed to free the pinched nerve.  HOME CARE INSTRUCTIONS   Take all medicine as directed by your health care provider. Only take over-the-counter or prescription medicines for pain, discomfort, or fever as directed by your health care provider.  If you were given a splint to keep your wrist from bending, wear it as directed. It  is important to wear the splint at night. Wear the splint for as long as you have pain or numbness in your hand, arm, or wrist. This may take 1 to 2 months.  Rest your wrist from any activity that may be causing your pain. If your symptoms are work-related, you may need to talk to your employer about changing to a job that does not require using your wrist.  Put ice on your wrist after long periods of wrist activity.  Put ice in a plastic bag.  Place a towel between your skin and the bag.  Leave the ice on for 15-20 minutes, 03-04 times a day.  Keep all follow-up visits as directed by your health care provider. This includes any orthopedic referrals, physical therapy, and rehabilitation. Any delay in getting necessary care could result in a delay or failure of your condition to heal. SEEK IMMEDIATE MEDICAL CARE IF:   You have new, unexplained symptoms.  Your symptoms get worse and are not helped or controlled with medicines. MAKE SURE YOU:   Understand these instructions.  Will watch your condition.  Will get help right away if you are not doing well or get worse. Document Released: 12/02/2000 Document Revised: 04/21/2014 Document Reviewed: 10/21/2011 [facility] Patient Information 2015 [place], [place]. This information is not intended to replace advice given to you by your health care provider. Make sure you discuss any questions you have with your health care provider.  Naproxen and naproxen sodium oral immediate-release tablets What is this medicine? NAPROXEN (na PROX en) is a non-steroidal anti-inflammatory drug (NSAID). It is used to  reduce swelling and to treat pain. This medicine may be used for dental pain, headache, or painful monthly periods. It is also used for painful joint and muscular problems such as arthritis, tendinitis, bursitis, and gout. This medicine may be used for other purposes; ask your health care provider or pharmacist if you have questions. COMMON BRAND  NAME(S): Aflaxen, Aleve, Aleve Arthritis, All Day Relief, Anaprox, Anaprox DS, Naprosyn What should I tell my health care provider before I take this medicine? They need to know if you have any of these conditions: -asthma -cigarette smoker -drink more than 3 alcohol containing drinks a day -heart disease or circulation problems such as heart failure or leg edema (fluid retention) -high blood pressure -kidney disease -liver disease -stomach bleeding or ulcers -an unusual or allergic reaction to naproxen, aspirin, other NSAIDs, other medicines, foods, dyes, or preservatives -pregnant or trying to get pregnant -breast-feeding How should I use this medicine? Take this medicine by mouth with a glass of water. Follow the directions on the prescription label. Take it with food if your stomach gets upset. Try to not lie down for at least 10 minutes after you take it. Take your medicine at regular intervals. Do not take your medicine more often than directed. Long-term, continuous use may increase the risk of heart attack or stroke. A special MedGuide will be given to you by the pharmacist with each prescription and refill. Be sure to read this information carefully each time. Talk to your pediatrician regarding the use of this medicine in children. Special care may be needed. Overdosage: If you think you have taken too much of this medicine contact a poison control center or emergency room at once. NOTE: This medicine is only for you. Do not share this medicine with others. What if I miss a dose? If you miss a dose, take it as soon as you can. If it is almost time for your next dose, take only that dose. Do not take double or extra doses. What may interact with this medicine? -alcohol -aspirin -cidofovir -diuretics -lithium -methotrexate -other drugs for inflammation like ketorolac or prednisone -pemetrexed -probenecid -warfarin This list may not describe all possible interactions. Give  your health care provider a list of all the medicines, herbs, non-prescription drugs, or dietary supplements you use. Also tell them if you smoke, drink alcohol, or use illegal drugs. Some items may interact with your medicine. What should I watch for while using this medicine? Tell your doctor or health care professional if your pain does not get better. Talk to your doctor before taking another medicine for pain. Do not treat yourself. This medicine does not prevent heart attack or stroke. In fact, this medicine may increase the chance of a heart attack or stroke. The chance may increase with longer use of this medicine and in people who have heart disease. If you take aspirin to prevent heart attack or stroke, talk with your doctor or health care professional. Do not take other medicines that contain aspirin, ibuprofen, or naproxen with this medicine. Side effects such as stomach upset, nausea, or ulcers may be more likely to occur. Many medicines available without a prescription should not be taken with this medicine. This medicine can cause ulcers and bleeding in the stomach and intestines at any time during treatment. Do not smoke cigarettes or drink alcohol. These increase irritation to your stomach and can make it more susceptible to damage from this medicine. Ulcers and bleeding can happen without warning symptoms and  can cause death. You may get drowsy or dizzy. Do not drive, use machinery, or do anything that needs mental alertness until you know how this medicine affects you. Do not stand or sit up quickly, especially if you are an older patient. This reduces the risk of dizzy or fainting spells. This medicine can cause you to bleed more easily. Try to avoid damage to your teeth and gums when you brush or floss your teeth. What side effects may I notice from receiving this medicine? Side effects that you should report to your doctor or health care professional as soon as possible: -black or  bloody stools, blood in the urine or vomit -blurred vision -chest pain -difficulty breathing or wheezing -nausea or vomiting -severe stomach pain -skin rash, skin redness, blistering or peeling skin, hives, or itching -slurred speech or weakness on one side of the body -swelling of eyelids, throat, lips -unexplained weight gain or swelling -unusually weak or tired -yellowing of eyes or skin Side effects that usually do not require medical attention (report to your doctor or health care professional if they continue or are bothersome): -constipation -headache -heartburn This list may not describe all possible side effects. Call your doctor for medical advice about side effects. You may report side effects to FDA at 1-800-FDA-1088. Where should I keep my medicine? Keep out of the reach of children. Store at room temperature between 15 and 30 degrees C (59 and 86 degrees F). Keep container tightly closed. Throw away any unused medicine after the expiration date. NOTE: This sheet is a summary. It may not cover all possible information. If you have questions about this medicine, talk to your doctor, pharmacist, or health care provider.  2015, Elsevier/Gold Standard. (2009-12-07 20:10:16)  Acetaminophen; Oxycodone tablets What is this medicine? ACETAMINOPHEN; OXYCODONE (a set a MEE noe fen; ox i KOE done) is a pain reliever. It is used to treat mild to moderate pain. This medicine may be used for other purposes; ask your health care provider or pharmacist if you have questions. COMMON BRAND NAME(S): Endocet, Magnacet, Narvox, Percocet, Perloxx, Primalev, Primlev, Roxicet, Xolox What should I tell my health care provider before I take this medicine? They need to know if you have any of these conditions: -brain tumor -Crohn's disease, inflammatory bowel disease, or ulcerative colitis -drug abuse or addiction -head injury -heart or circulation problems -if you often drink alcohol -kidney  disease or problems going to the bathroom -liver disease -lung disease, asthma, or breathing problems -an unusual or allergic reaction to acetaminophen, oxycodone, other opioid analgesics, other medicines, foods, dyes, or preservatives -pregnant or trying to get pregnant -breast-feeding How should I use this medicine? Take this medicine by mouth with a full glass of water. Follow the directions on the prescription label. Take your medicine at regular intervals. Do not take your medicine more often than directed. Talk to your pediatrician regarding the use of this medicine in children. Special care may be needed. Patients over 27 years old may have a stronger reaction and need a smaller dose. Overdosage: If you think you have taken too much of this medicine contact a poison control center or emergency room at once. NOTE: This medicine is only for you. Do not share this medicine with others. What if I miss a dose? If you miss a dose, take it as soon as you can. If it is almost time for your next dose, take only that dose. Do not take double or extra doses. What may interact  with this medicine? -alcohol -antihistamines -barbiturates like amobarbital, butalbital, butabarbital, methohexital, pentobarbital, phenobarbital, thiopental, and secobarbital -benztropine -drugs for bladder problems like solifenacin, trospium, oxybutynin, tolterodine, hyoscyamine, and methscopolamine -drugs for breathing problems like ipratropium and tiotropium -drugs for certain stomach or intestine problems like propantheline, homatropine methylbromide, glycopyrrolate, atropine, belladonna, and dicyclomine -general anesthetics like etomidate, ketamine, nitrous oxide, propofol, desflurane, enflurane, halothane, isoflurane, and sevoflurane -medicines for depression, anxiety, or psychotic disturbances -medicines for sleep -muscle relaxants -naltrexone -narcotic medicines (opiates) for pain -phenothiazines like  perphenazine, thioridazine, chlorpromazine, mesoridazine, fluphenazine, prochlorperazine, promazine, and trifluoperazine -scopolamine -tramadol -trihexyphenidyl This list may not describe all possible interactions. Give your health care provider a list of all the medicines, herbs, non-prescription drugs, or dietary supplements you use. Also tell them if you smoke, drink alcohol, or use illegal drugs. Some items may interact with your medicine. What should I watch for while using this medicine? Tell your doctor or health care professional if your pain does not go away, if it gets worse, or if you have new or a different type of pain. You may develop tolerance to the medicine. Tolerance means that you will need a higher dose of the medication for pain relief. Tolerance is normal and is expected if you take this medicine for a long time. Do not suddenly stop taking your medicine because you may develop a severe reaction. Your body becomes used to the medicine. This does NOT mean you are addicted. Addiction is a behavior related to getting and using a drug for a non-medical reason. If you have pain, you have a medical reason to take pain medicine. Your doctor will tell you how much medicine to take. If your doctor wants you to stop the medicine, the dose will be slowly lowered over time to avoid any side effects. You may get drowsy or dizzy. Do not drive, use machinery, or do anything that needs mental alertness until you know how this medicine affects you. Do not stand or sit up quickly, especially if you are an older patient. This reduces the risk of dizzy or fainting spells. Alcohol may interfere with the effect of this medicine. Avoid alcoholic drinks. There are different types of narcotic medicines (opiates) for pain. If you take more than one type at the same time, you may have more side effects. Give your health care provider a list of all medicines you use. Your doctor will tell you how much medicine to  take. Do not take more medicine than directed. Call emergency for help if you have problems breathing. The medicine will cause constipation. Try to have a bowel movement at least every 2 to 3 days. If you do not have a bowel movement for 3 days, call your doctor or health care professional. Do not take Tylenol (acetaminophen) or medicines that have acetaminophen with this medicine. Too much acetaminophen can be very dangerous. Many nonprescription medicines contain acetaminophen. Always read the labels carefully to avoid taking more acetaminophen. What side effects may I notice from receiving this medicine? Side effects that you should report to your doctor or health care professional as soon as possible: -allergic reactions like skin rash, itching or hives, swelling of the face, lips, or tongue -breathing difficulties, wheezing -confusion -light headedness or fainting spells -severe stomach pain -unusually weak or tired -yellowing of the skin or the whites of the eyes Side effects that usually do not require medical attention (report to your doctor or health care professional if they continue or are bothersome): -dizziness -drowsiness -  nausea -vomiting This list may not describe all possible side effects. Call your doctor for medical advice about side effects. You may report side effects to FDA at 1-800-FDA-1088. Where should I keep my medicine? Keep out of the reach of children. This medicine can be abused. Keep your medicine in a safe place to protect it from theft. Do not share this medicine with anyone. Selling or giving away this medicine is dangerous and against the law. Store at room temperature between 20 and 25 degrees C (68 and 77 degrees F). Keep container tightly closed. Protect from light. This medicine may cause accidental overdose and death if it is taken by other adults, children, or pets. Flush any unused medicine down the toilet to reduce the chance of harm. Do not use the  medicine after the expiration date. NOTE: This sheet is a summary. It may not cover all possible information. If you have questions about this medicine, talk to your doctor, pharmacist, or health care provider.  2015, Elsevier/Gold Standard. (2013-07-29 13:17:35)

## 2015-04-16 ENCOUNTER — Ambulatory Visit: Payer: No Typology Code available for payment source | Admitting: Gastroenterology

## 2015-05-11 ENCOUNTER — Encounter: Payer: Self-pay | Admitting: Gastroenterology

## 2015-05-11 ENCOUNTER — Ambulatory Visit (INDEPENDENT_AMBULATORY_CARE_PROVIDER_SITE_OTHER): Payer: 59 | Admitting: Gastroenterology

## 2015-05-11 VITALS — BP 124/82 | HR 97 | Temp 98.6°F | Ht 63.0 in | Wt 168.8 lb

## 2015-05-11 DIAGNOSIS — K3184 Gastroparesis: Secondary | ICD-10-CM

## 2015-05-11 DIAGNOSIS — R109 Unspecified abdominal pain: Secondary | ICD-10-CM | POA: Diagnosis not present

## 2015-05-11 DIAGNOSIS — R197 Diarrhea, unspecified: Secondary | ICD-10-CM | POA: Diagnosis not present

## 2015-05-11 MED ORDER — ESOMEPRAZOLE MAGNESIUM 40 MG PO CPDR: 40.0000 mg | DELAYED_RELEASE_CAPSULE | Freq: Every day | ORAL | Status: DC

## 2015-05-11 MED ORDER — ELUXADOLINE 100 MG PO TABS: 1.0000 | ORAL_TABLET | Freq: Two times a day (BID) | ORAL | Status: DC

## 2015-05-11 NOTE — Progress Notes (Signed)
Referring Provider: Elfredia NevinsFusco, Lawrence, MD Primary Care Physician:  Cassell SmilesFUSCO,LAWRENCE J., MD  Primary GI: Dr. Jena Gaussourk   Chief Complaint  Patient presents with  . Follow-up    HPI:   Kiara Dillon is a 34 y.o. female presenting today with a history of chronic abdominal pain, gastroparesis, IBS. EGD/TCS, CT, GES on file. HBT negative. Marinol BID prescribed in Sept 2015. Intolerant to Reglan. Hasn't seen any difference with Marinol, so stopped taking everything except for Phenergan. Referred to pain management at last appt in Dec 2015. Referral ran out, still hasn't gone.   Left-side abdomen still with pain. Doesn't breathe deep anymore due to pain. Diarrhea predominant IBS. Under a lot of stress. Lost pain management referral. On Flagyl now for what sounds like BV. Having 5+ loose stools per day. Significant abdominal pain. No fever or chills. Significant postprandial diarrhea. Not on a PPI.   Past Medical History  Diagnosis Date  . Cholestasis of pregnancy   . 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 Asthma   . Complication of anesthesia   . PONV (postoperative nausea and vomiting)   . GERD (gastroesophageal reflux disease)   . Hypertension     sts hasnt had any trouble or been on meds in 2 years.  . Degenerative disc disease   . Chronic back pain     Past Surgical History  Procedure Laterality Date  . Wisdom tooth extraction    . No past surgeries    . Esophagogastroduodenoscopy  02/2014    Dr. Jeani HawkingPatrick Hung: (propofol) retained fluid/gastric contents found in gastric fundus and body. Fluid aspiration to clear gastric lumen. Patient developed asthmatic exacerbation during procedure and had to be woke up to receive nebulizer.  . Colonoscopy  02/2014    Dr. Luisa HartPatrick Hung:(propofol) normal  . Cholecystectomy N/A 04/14/2014    Procedure: LAPAROSCOPIC CHOLECYSTECTOMY;  Surgeon: Dalia HeadingMark A Jenkins, MD;  Location: [facility];  Service: General;  Laterality: N/A;  . Gallbladder  surgery    . Bacterial overgrowth test N/A 09/16/2014    Procedure: BACTERIAL OVERGROWTH TEST;  Surgeon: Corbin Adeobert M Rourk, MD;  Location: [facility];  Service: Endoscopy;  Laterality: N/A;  0730 - rescheduled to 9/29 - Ginger to notify pt    Current Outpatient Prescriptions  Medication Sig Dispense Refill  . naproxen (NAPROSYN) 500 MG tablet Take 1 tablet (500 mg total) by mouth 2 (two) times daily. 30 tablet 0  . ORTHO TRI-CYCLEN, 28, 0.18/0.215/0.25 MG-35 MCG tablet     . promethazine (PHENERGAN) 25 MG tablet Take 25 mg by mouth every 4 (four) hours as needed for nausea or vomiting.    Marland Kitchen. oxyCODONE-acetaminophen (PERCOCET) 5-325 MG per tablet Take 1 tablet by mouth every 4 (four) hours as needed for moderate pain. (Patient not taking: Reported on 05/11/2015) 12 tablet 0   No current facility-administered medications for this visit.    Allergies as of 05/11/2015 - Review Complete 05/11/2015  Allergen Reaction Noted  . Penicillins Other (See Comments) 09/29/2011    Family History  Problem Relation Age of Onset  . Arthritis Mother   . Asthma Mother   . Depression Mother   . Hypertension Mother   . Miscarriages / Stillbirths Maternal Aunt   . Stroke Paternal Uncle   . Heart disease Paternal Uncle   . Cancer Maternal Grandfather     kidney  . Heart disease Paternal Grandmother     History   Social History  .  Marital Status: Single    Spouse Name: N/A  . Number of Children: 1  . Years of Education: N/A   Occupational History  .     Social History Main Topics  . Smoking status: Current Some Day Smoker -- 0.50 packs/day for 5 years    Types: Cigarettes    Last Attempt to Quit: 04/11/2011  . Smokeless tobacco: Never Used     Comment: Quit x 2 years  . Alcohol Use: No  . Drug Use: No  . Sexual Activity: Yes    Birth Control/ Protection: None   Other Topics Concern  . None   Social History Narrative    Review of Systems: As mentioned in HPI  Physical Exam: BP  124/82 mmHg  Pulse 97  Temp(Src) 98.6 F (37 C)  Ht  (1.6 m)  Wt 168 lb 12.8 oz (76.567 kg)  BMI 29.91 kg/m2  LMP 04/20/2015 General:   Alert and oriented. No distress noted. Pleasant and cooperative.  Head:  Normocephalic and atraumatic. Eyes:  Conjuctiva clear without scleral icterus. Abdomen:  +BS, soft, mild TTP left-sided abdomen and non-distended. No rebound or guarding. No HSM or masses noted. Msk:  Symmetrical without gross deformities. Normal posture. Extremities:  Without edema. Neurologic:  Alert and  oriented x4;  grossly normal neurologically. Skin:  Intact without significant lesions or rashes. Psych:  Alert and cooperative. Normal mood and affect.

## 2015-05-11 NOTE — Patient Instructions (Addendum)
Please complete the stool sample as soon as possible. Once we see that this is negative, THEN you can start Viberzi 1 tablet twice a day with food. You will monitor for any worsening abdominal pain, nausea, or vomiting. Viberzi is for your abdominal pain and diarrhea.   Continue Phenergan as needed. Please start Nexium once daily, 30 minutes before breakfast daily. I sent this to your pharmacy.   I have referred you back to pain management.

## 2015-05-12 ENCOUNTER — Other Ambulatory Visit: Payer: Self-pay

## 2015-05-12 ENCOUNTER — Telehealth: Payer: Self-pay

## 2015-05-12 ENCOUNTER — Encounter: Payer: Self-pay | Admitting: *Deleted

## 2015-05-12 DIAGNOSIS — R109 Unspecified abdominal pain: Secondary | ICD-10-CM

## 2015-05-12 NOTE — Telephone Encounter (Signed)
Received a fax from the pharmacy. pts insurance does not cover esomeprazole. Their preferred medications is: omeprazole, pantoprazole, rabeprazole, and dexilant. Pt has tried omeprazole. Do you want to change?

## 2015-05-13 ENCOUNTER — Ambulatory Visit: Payer: No Typology Code available for payment source | Admitting: Neurology

## 2015-05-13 MED ORDER — PANTOPRAZOLE SODIUM 40 MG PO TBEC: 40.0000 mg | DELAYED_RELEASE_TABLET | Freq: Every day | ORAL | Status: DC

## 2015-05-13 NOTE — Telephone Encounter (Signed)
Tried to call pt- NA and mailbox was full and could not accept messages at this time.

## 2015-05-13 NOTE — Telephone Encounter (Signed)
Let's do Protonix. I am sending to pharmacy.

## 2015-05-14 ENCOUNTER — Telehealth: Payer: Self-pay | Admitting: Gastroenterology

## 2015-05-14 NOTE — Telephone Encounter (Signed)
Tried to call pt- LMOM with information. Asked her to call me back if she had any problems or if the medication didn't work.

## 2015-05-14 NOTE — Assessment & Plan Note (Signed)
Acute on chronic loose stools in setting of known IBS. Recent antibiotics. Check Cdiff now. If negative, proceed with Viberzi 75 mg po BID.

## 2015-05-14 NOTE — Telephone Encounter (Signed)
Tried to call pt- LM 

## 2015-05-14 NOTE — Telephone Encounter (Signed)
Please see if patient has completed Cdiff sample. Once this is documented as negative, may start Viberzi.

## 2015-05-14 NOTE — Progress Notes (Signed)
CC'ED TO PCP 

## 2015-05-14 NOTE — Assessment & Plan Note (Signed)
Chronic with extensive work-up as noted in HPI. Refer to Pain Management again.

## 2015-05-14 NOTE — Telephone Encounter (Signed)
Mailed letter to pt

## 2015-05-14 NOTE — Assessment & Plan Note (Addendum)
Intolerant to Reglan, no improvement with Marinol and other agents. Weight stable. Phenergan prn. No concerning signs. Not on a PPI. Originally was going to start on Nexium, but this is not covered by her insurance. Start Protonix once daily.

## 2015-06-15 ENCOUNTER — Telehealth: Payer: Self-pay | Admitting: *Deleted

## 2015-06-15 NOTE — Telephone Encounter (Signed)
Pt called just to let us know that she is waiting for her medicaid and then she will be back to see [place].

## 2015-07-26 ENCOUNTER — Emergency Department (HOSPITAL_COMMUNITY)
Admission: EM | Admit: 2015-07-26 | Discharge: 2015-07-26 | Disposition: A | Discharge status: Home / Self Care | Payer: Medicaid Other | Attending: Emergency Medicine | Admitting: Emergency Medicine

## 2015-07-26 ENCOUNTER — Encounter (HOSPITAL_COMMUNITY): Payer: Self-pay | Admitting: Emergency Medicine

## 2015-07-26 DIAGNOSIS — Z8739 Personal history of other diseases of the musculoskeletal system and connective tissue: Secondary | ICD-10-CM | POA: Diagnosis not present

## 2015-07-26 DIAGNOSIS — G8929 Other chronic pain: Secondary | ICD-10-CM | POA: Diagnosis not present

## 2015-07-26 DIAGNOSIS — Z720 Tobacco use: Secondary | ICD-10-CM | POA: Insufficient documentation

## 2015-07-26 DIAGNOSIS — Z880 Allergy status to penicillin: Secondary | ICD-10-CM | POA: Insufficient documentation

## 2015-07-26 DIAGNOSIS — K219 Gastro-esophageal reflux disease without esophagitis: Secondary | ICD-10-CM | POA: Insufficient documentation

## 2015-07-26 DIAGNOSIS — Z79899 Other long term (current) drug therapy: Secondary | ICD-10-CM | POA: Insufficient documentation

## 2015-07-26 DIAGNOSIS — Z791 Long term (current) use of non-steroidal anti-inflammatories (NSAID): Secondary | ICD-10-CM | POA: Insufficient documentation

## 2015-07-26 DIAGNOSIS — Z8619 Personal history of other infectious and parasitic diseases: Secondary | ICD-10-CM | POA: Diagnosis not present

## 2015-07-26 DIAGNOSIS — M722 Plantar fascial fibromatosis: Secondary | ICD-10-CM | POA: Diagnosis not present

## 2015-07-26 DIAGNOSIS — J45909 Unspecified asthma, uncomplicated: Secondary | ICD-10-CM | POA: Insufficient documentation

## 2015-07-26 DIAGNOSIS — M79672 Pain in left foot: Secondary | ICD-10-CM | POA: Diagnosis present

## 2015-07-26 DIAGNOSIS — I10 Essential (primary) hypertension: Secondary | ICD-10-CM | POA: Diagnosis not present

## 2015-07-26 MED ORDER — HYDROCODONE-ACETAMINOPHEN 5-325 MG PO TABS: 1.0000 | ORAL_TABLET | Freq: Four times a day (QID) | ORAL | Status: DC | PRN

## 2015-07-26 MED ORDER — PREDNISONE 50 MG PO TABS: 50.0000 mg | ORAL_TABLET | Freq: Every day | ORAL | Status: DC

## 2015-07-26 NOTE — ED Notes (Signed)
Pt reports bilateral leg pain with worse pain on left leg x 1 month. Pt denies fall or injury.

## 2015-07-26 NOTE — ED Provider Notes (Signed)
CSN: [id number]     Arrival date & time 07/26/15  2212 History  This chart was scribed for non-physician practitioner Ebbie Ridge, PA-C working with Lavera Guise, MD by Leone Payor, ED Scribe. This patient was seen in room WTR5/WTR5 and the patient's care was started at 10:23 PM.    No chief complaint on file.  The history is provided by the patient. No language interpreter was used.     HPI Comments: Kiara Dillon is a 34 y.o. female who presents to the Emergency Department complaining of 1 month of constant, gradually worsened left foot pain. She describes her pain as tingling and rates it as 8/10 currently. She states standing and walking aggravates her pain. She reports her job requires long periods of standing. She denies recent falls or injuries. She denies weakness or numbness.   Past Medical History  Diagnosis Date  . Cholestasis of pregnancy   . 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 Asthma   . Complication of anesthesia   . PONV (postoperative nausea and vomiting)   . GERD (gastroesophageal reflux disease)   . Hypertension     sts hasnt had any trouble or been on meds in 2 years.  . Degenerative disc disease   . Chronic back pain   . Chronic headache     photophobia   Past Surgical History  Procedure Laterality Date  . Wisdom tooth extraction    . No past surgeries    . Esophagogastroduodenoscopy  02/2014    Dr. Jeani Hawking: (propofol) retained fluid/gastric contents found in gastric fundus and body. Fluid aspiration to clear gastric lumen. Patient developed asthmatic exacerbation during procedure and had to be woke up to receive nebulizer.  . Colonoscopy  02/2014    Dr. Luisa Hart Hung:(propofol) normal  . Cholecystectomy N/A 04/14/2014    Procedure: LAPAROSCOPIC CHOLECYSTECTOMY;  Surgeon: Dalia Heading, MD;  Location: [facility];  Service: General;  Laterality: N/A;  . Gallbladder surgery    . Bacterial overgrowth test N/A 09/16/2014    Procedure: BACTERIAL  OVERGROWTH TEST;  Surgeon: Corbin Ade, MD;  Location: [facility];  Service: Endoscopy;  Laterality: N/A;  0730 - rescheduled to 9/29 - Ginger to notify pt   Family History  Problem Relation Age of Onset  . Arthritis Mother   . Asthma Mother   . Depression Mother   . Hypertension Mother   . Miscarriages / Stillbirths Maternal Aunt   . Stroke Paternal Uncle   . Heart disease Paternal Uncle   . Cancer Maternal Grandfather     kidney  . Heart disease Paternal Grandmother    History  Substance Use Topics  . Smoking status: Current Some Day Smoker -- 0.50 packs/day for 5 years    Types: Cigarettes    Last Attempt to Quit: 04/11/2011  . Smokeless tobacco: Never Used     Comment: Quit x 2 years  . Alcohol Use: No   OB History    Gravida Para Term Preterm AB TAB SAB Ectopic Multiple Living   0 1 1 0 0 0 1     Review of Systems  Musculoskeletal: Positive for myalgias and arthralgias.  Neurological: Negative for weakness and numbness.      Allergies  Penicillins  Home Medications   Prior to Admission medications   Medication Sig Start Date End Date Taking? Authorizing Provider  Eluxadoline (VIBERZI) 100 MG TABS Take 1 tablet by  mouth 2 (two) times daily with a meal. 05/11/15   Nira Retort, NP  esomeprazole (NEXIUM) 40 MG capsule Take 1 capsule (40 mg total) by mouth daily before breakfast. 05/11/15   Nira Retort, NP  naproxen (NAPROSYN) 500 MG tablet Take 1 tablet (500 mg total) by mouth 2 (two) times daily. 03/25/15   Dione Booze, MD  ORTHO TRI-CYCLEN, 28, 0.18/0.215/0.25 MG-35 MCG tablet  04/01/15   Historical Provider, MD  pantoprazole (PROTONIX) 40 MG tablet Take 1 tablet (40 mg total) by mouth daily. 05/13/15   Nira Retort, NP  promethazine (PHENERGAN) 25 MG tablet Take 25 mg by mouth every 4 (four) hours as needed for nausea or vomiting.    Historical Provider, MD   There were no vitals taken for this visit. Physical Exam  Constitutional: She is oriented to  person, place, and time. She appears well-developed and well-nourished.  HENT:  Head: Normocephalic and atraumatic.  Cardiovascular: Normal rate.   Pulmonary/Chest: Effort normal.  Abdominal: She exhibits no distension.  Musculoskeletal: Normal range of motion. She exhibits tenderness. She exhibits no edema.  Tenderness over the origin of the plantar fascia on the left foot.   Neurological: She is alert and oriented to person, place, and time.  Skin: Skin is warm and dry.  Psychiatric: She has a normal mood and affect.  Nursing note and vitals reviewed.   ED Course  Procedures (including critical care time)  DIAGNOSTIC STUDIES:  COORDINATION OF CARE: 10:30 PM Discussed treatment plan with pt at bedside and pt agreed to plan.   I personally performed the services described in this documentation, which was scribed in my presence. The recorded information has been reviewed and is accurate.   Charlestine Night, PA-C 07/28/15 1610  Lavera Guise, MD 07/28/15 1257

## 2015-07-26 NOTE — Discharge Instructions (Signed)
Return here as needed.  Follow-up with the orthopedist provided.  Make sure that you stretch each morning and use ice on the foot

## 2015-08-26 ENCOUNTER — Encounter: Payer: Self-pay | Admitting: Internal Medicine

## 2015-09-08 ENCOUNTER — Encounter: Payer: Self-pay | Admitting: Gastroenterology

## 2015-09-08 ENCOUNTER — Ambulatory Visit: Payer: Medicaid Other | Admitting: Gastroenterology

## 2015-09-08 ENCOUNTER — Telehealth: Payer: Self-pay | Admitting: Gastroenterology

## 2015-09-08 NOTE — Telephone Encounter (Signed)
PATIENT WAS A NO SHOW AND LETTER SENT  °

## 2015-10-21 ENCOUNTER — Ambulatory Visit (INDEPENDENT_AMBULATORY_CARE_PROVIDER_SITE_OTHER): Payer: Medicaid Other | Admitting: Gastroenterology

## 2015-10-21 ENCOUNTER — Encounter: Payer: Self-pay | Admitting: Gastroenterology

## 2015-10-21 VITALS — BP 130/83 | HR 112 | Temp 97.6°F | Ht 63.0 in | Wt 175.8 lb

## 2015-10-21 DIAGNOSIS — K580 Irritable bowel syndrome with diarrhea: Secondary | ICD-10-CM

## 2015-10-21 DIAGNOSIS — K589 Irritable bowel syndrome without diarrhea: Secondary | ICD-10-CM | POA: Insufficient documentation

## 2015-10-21 DIAGNOSIS — K3184 Gastroparesis: Secondary | ICD-10-CM

## 2015-10-21 MED ORDER — PROMETHAZINE HCL 25 MG PO TABS: 25.0000 mg | ORAL_TABLET | Freq: Four times a day (QID) | ORAL | Status: DC | PRN

## 2015-10-21 MED ORDER — ELUXADOLINE 75 MG PO TABS: 1.0000 | ORAL_TABLET | Freq: Two times a day (BID) | ORAL | Status: DC

## 2015-10-21 MED ORDER — PANTOPRAZOLE SODIUM 40 MG PO TBEC: 40.0000 mg | DELAYED_RELEASE_TABLET | Freq: Every day | ORAL | Status: DC

## 2015-10-21 NOTE — Progress Notes (Signed)
Referring Provider: Elfredia Nevins, MD Primary Care Physician:  Cassell Smiles., MD  Primary GI: Dr. Jena Gauss   Chief Complaint  Patient presents with  . Follow-up    HPI:   Kiara Dillon is a 34 y.o. female presenting today with a history of chronic abdominal pain, gastroparesis, IBS. EGD/TCS, CT, GES on file. HBT negative. Marinol BID prescribed in Sept 2015. Intolerant to Reglan. Hasn't seen any difference with Marinol, so stopped taking everything except for Phenergan. Last seen May 2016 with diarrhea and abdominal pain. Due to insurance issues, just now returning to clinic. Out of all medications. Needs new prescriptions.   Diarrhea every day. Chronic. Can't remember last time had a normal stool. For the past week has noted rectal bleeding. Has abdominal bloating, discomfort. Worried about eating because of eating. Can't sleep on left-side due to pain. Has to take shallow breaths because painful. Feels like it may be her bladder because she has to bend over while urinating to press more urine out. If urinates, pain improves. Nausea but no vomiting.   Past Medical History  Diagnosis Date  . Cholestasis of pregnancy   . 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 Asthma   . Complication of anesthesia   . PONV (postoperative nausea and vomiting)   . GERD (gastroesophageal reflux disease)   . Hypertension     sts hasnt had any trouble or been on meds in 2 years.  . Degenerative disc disease   . Chronic back pain   . Chronic headache     photophobia    Past Surgical History  Procedure Laterality Date  . Wisdom tooth extraction    . No past surgeries    . Esophagogastroduodenoscopy  02/2014    Dr. Jeani Hawking: (propofol) retained fluid/gastric contents found in gastric fundus and body. Fluid aspiration to clear gastric lumen. Patient developed asthmatic exacerbation during procedure and had to be woke up to receive nebulizer.  . Colonoscopy  02/2014    Dr.  Luisa Hart Hung:(propofol) normal  . Cholecystectomy N/A 04/14/2014    Procedure: LAPAROSCOPIC CHOLECYSTECTOMY;  Surgeon: Dalia Heading, MD;  Location: [facility];  Service: General;  Laterality: N/A;  . Gallbladder surgery    . Bacterial overgrowth test N/A 09/16/2014    Procedure: BACTERIAL OVERGROWTH TEST;  Surgeon: Corbin Ade, MD;  Location: [facility];  Service: Endoscopy;  Laterality: N/A;  0730 - rescheduled to 9/29 - Ginger to notify pt    Current Outpatient Prescriptions  Medication Sig Dispense Refill  . Eluxadoline (VIBERZI) 75 MG TABS Take 1 tablet by mouth 2 (two) times daily with a meal. 60 tablet 5  . pantoprazole (PROTONIX) 40 MG tablet Take 1 tablet (40 mg total) by mouth daily. 30 minutes before breakfast 90 tablet 3  . promethazine (PHENERGAN) 25 MG tablet Take 1 tablet (25 mg total) by mouth every 6 (six) hours as needed for nausea or vomiting. 40 tablet 3   No current facility-administered medications for this visit.    Allergies as of 10/21/2015 - Review Complete 10/21/2015  Allergen Reaction Noted  . Penicillins Other (See Comments) 09/29/2011    Family History  Problem Relation Age of Onset  . Arthritis Mother   . Asthma Mother   . Depression Mother   . Hypertension Mother   . Miscarriages / Stillbirths Maternal Aunt   . Stroke Paternal Uncle   . Heart disease Paternal Uncle   . Cancer  Maternal Grandfather     kidney  . Heart disease Paternal Grandmother     Social History   Social History  . Marital Status: Single    Spouse Name: N/A  . Number of Children: 1  . Years of Education: N/A   Occupational History  .     Social History Main Topics  . Smoking status: Current Some Day Smoker -- 0.50 packs/day for 5 years    Types: Cigarettes    Last Attempt to Quit: 04/11/2011  . Smokeless tobacco: Never Used     Comment: Quit x 2 years  . Alcohol Use: No  . Drug Use: No  . Sexual Activity: Yes    Birth Control/ Protection: None   Other Topics  Concern  . None   Social History Narrative   Single, one child    Review of Systems: As mentioned in HPI   Physical Exam: BP 130/83 mmHg  Pulse 112  Temp(Src) 97.6 F (36.4 C) (Oral)  Ht 5\' 3"  (1.6 m)  Wt 175 lb 12.8 oz (79.742 kg)  BMI 31.15 kg/m2 General:   Alert and oriented. No distress noted. Pleasant and cooperative.  Head:  Normocephalic and atraumatic. Eyes:  Conjuctiva clear without scleral icterus. Mouth:  Oral mucosa pink and moist. Good dentition. No lesions. Heart:  S1, S2 present without murmurs, rubs, or gallops. Regular rate and rhythm. Abdomen:  +BS, soft, diffusely TTP, baseline for patient and non-distended. No rebound or guarding. No HSM or masses noted. Msk:  Symmetrical without gross deformities. Normal posture. Extremities:  Without edema. Neurologic:  Alert and  oriented x4;  grossly normal neurologically. Psych:  Alert and cooperative. Normal mood and affect.

## 2015-10-21 NOTE — Patient Instructions (Signed)
I have sent Protonix to your pharmacy to take once each morning, 30 minutes before breakfast. This is for reflux and to help with gastroparesis by keeping acid production down.  Phenergan: I have sent this to the pharmacy.  Viberzi: take 1 capsule twice a day with food. This is for irritable bowel syndrome with the goal of improving abdominal pain and stool consistency/frequency. If you do not have a bowel movement in 4 days, call me.   I have referred you to a urologist.   I will see you in 4 weeks!

## 2015-10-22 ENCOUNTER — Other Ambulatory Visit: Payer: Self-pay

## 2015-10-22 DIAGNOSIS — G8929 Other chronic pain: Secondary | ICD-10-CM

## 2015-10-22 DIAGNOSIS — R1031 Right lower quadrant pain: Secondary | ICD-10-CM

## 2015-10-22 DIAGNOSIS — R300 Dysuria: Secondary | ICD-10-CM

## 2015-10-28 NOTE — Assessment & Plan Note (Addendum)
Chronic diarrhea. Colonoscopy on file. Likely IBS-D. Trial Viberzi 75 mg po BID. As of note, chronic abdominal pain with thorough work-up on file. Notes urinary symptoms associated with abdominal pain, noting relief after urinating but difficulty with urinating as well. Referral to urology. Return in 4 weeks.

## 2015-10-28 NOTE — Progress Notes (Signed)
cc'ed to pcp °

## 2015-10-28 NOTE — Assessment & Plan Note (Signed)
Historically has failed multiple agents. Does well with Phenergan. Resume PPI daily (Protonix), and Phenergan prn. Return in 4 weeks.

## 2015-11-24 ENCOUNTER — Ambulatory Visit: Payer: Medicaid Other | Admitting: Gastroenterology

## 2015-11-27 ENCOUNTER — Ambulatory Visit (INDEPENDENT_AMBULATORY_CARE_PROVIDER_SITE_OTHER): Payer: Medicaid Other | Admitting: Urology

## 2015-11-27 DIAGNOSIS — R350 Frequency of micturition: Secondary | ICD-10-CM

## 2015-11-27 DIAGNOSIS — R3989 Other symptoms and signs involving the genitourinary system: Secondary | ICD-10-CM | POA: Diagnosis not present

## 2015-12-01 ENCOUNTER — Other Ambulatory Visit: Payer: Self-pay | Admitting: Urology

## 2015-12-09 NOTE — Patient Instructions (Signed)
Kiara Dillon  12/09/2015     @   Your procedure is scheduled on  12/18/2015   Report to [facility] at  1030  A.M.  Call this number if you have problems the morning of surgery:  [phone]   Remember:  Do not eat food or drink liquids after midnight.  Take these medicines the morning of surgery with A SIP OF WATER  Viberzi, protonix, phenergan.   Do not wear jewelry, make-up or nail polish.  Do not wear lotions, powders, or perfumes.  You may wear deodorant.  Do not shave 48 hours prior to surgery.  Men may shave face and neck.  Do not bring valuables to the hospital.  [facility] is not responsible for any belongings or valuables.  Contacts, dentures or bridgework may not be worn into surgery.  Leave your suitcase in the car.  After surgery it may be brought to your room.  For patients admitted to the hospital, discharge time will be determined by your treatment team.  Patients discharged the day of surgery will not be allowed to drive home.   Name and phone number of your driver:   family Special instructions:  none  Please read over the following fact sheets that you were given. Pain Booklet, Coughing and Deep Breathing, Surgical Site Infection Prevention, Anesthesia Post-op Instructions and Care and Recovery After Surgery      Hydrodistention of the Bladder Hydrodistention is a procedure to examine your bladder. During hydrodistention, your bladder is filled with fluid until it is more full than normal (distended). Your caregiver will use a thin tube with a camera on the end to examine your urethra and bladder (cystoscope). LET YOUR CAREGIVER KNOW ABOUT:  Any allergies you have.  All medicines you are taking, including vitamins, herbs, eyedrops, and over-the-counter medicines and creams.  Previous problems you or members of your family have had with the use of anesthetics.  Any blood disorders you have.  Other health problems  you have. RISKS AND COMPLICATIONS Generally, hydrodistention is a safe procedure. However, as with any surgical procedure, complications can occur. Possible complications associated with hydrodistention include:  Bleeding.  Infection.  Bladder puncture.  Difficulty urinating.  Temporary inability to urinate. If this happens you may need a tube to drain urine from your bladder outside of your body (urinary catheter) for a short period after your procedure. BEFORE THE PROCEDURE  Do not eat or drink anything for at least 6 hours before your procedure.  Make plans for someone to drive you home after the procedure. PROCEDURE Hydrodistention of the bladder is an outpatient procedure. You will not need to stay overnight. It may be done in a hospital or in a surgery clinic. It usually takes about 30 minutes.  Small monitors will be placed on your body. They are used to check your heart rate, blood pressure level, and oxygen level during the procedure. An intravenous (IV) tube will be inserted into one of your veins. Fluids and medicine will flow directly into your body through the IV tube. You may be given medicine to make you sleep (general anesthetic) or you may be given medicine that makes you numb from the waist down (spinal anesthesia). Your caregiver will gently glide the cystoscope into the tube through which urine flows out of your body (urethra) all the way to your bladder. Once in your bladder, fluid will flow through the cystoscope until your bladder is full.  Your caregiver will measure the amount of fluid it takes to fill your bladder. If your caregiver notices any abnormal tissue in your bladder, a tissue sample will be removed and sent to a lab to be examined (biopsy). When your caregiver has finished the exam, the fluid will be drained from your bladder and the cystoscope will be removed. AFTER THE PROCEDURE You will stay in a recovery area until the anesthetic wears off. Your pulse and  blood pressure will be frequently monitored until you are stable. Then you can go home.   This information is not intended to replace advice given to you by your health care provider. Make sure you discuss any questions you have with your health care provider.   Document Released: 08/29/2012 Document Reviewed: 08/29/2012 Elsevier Interactive Patient Education [employer] Cystoscopy Cystoscopy is a procedure that is used to help your caregiver diagnose and sometimes treat conditions that affect your lower urinary tract. Your lower urinary tract includes your bladder and the tube through which urine passes from your bladder out of your body (urethra). Cystoscopy is performed with a thin, tube-shaped instrument (cystoscope). The cystoscope has lenses and a light at the end so that your caregiver can see inside your bladder. The cystoscope is inserted at the entrance of your urethra. Your caregiver guides it through your urethra and into your bladder. There are two main types of cystoscopy:  Flexible cystoscopy (with a flexible cystoscope).  Rigid cystoscopy (with a rigid cystoscope). Cystoscopy may be recommended for many conditions, including:  Urinary tract infections.  Blood in your urine (hematuria).  Loss of bladder control (urinary incontinence) or overactive bladder.  Unusual cells found in a urine sample.  Urinary blockage.  Painful urination. Cystoscopy may also be done to remove a sample of your tissue to be checked under a microscope (biopsy). It may also be done to remove or destroy bladder stones. LET YOUR CAREGIVER KNOW ABOUT:  Allergies to food or medicine.  Medicines taken, including vitamins, herbs, eyedrops, over-the-counter medicines, and creams.  Use of steroids (by mouth or creams).  Previous problems with anesthetics or numbing medicines.  History of bleeding problems or blood clots.  Previous surgery.  Other health problems, including diabetes and  kidney problems.  Possibility of pregnancy, if this applies. PROCEDURE The area around the opening to your urethra will be cleaned. A medicine to numb your urethra (local anesthetic) is used. If a tissue sample or stone is removed during the procedure, you may be given a medicine to make you sleep (general anesthetic). Your caregiver will gently insert the tip of the cystoscope into your urethra. The cystoscope will be slowly glided through your urethra and into your bladder. Sterile fluid will flow through the cystoscope and into your bladder. The fluid will expand and stretch your bladder. This gives your caregiver a better view of your bladder walls. The procedure lasts about 15-20 minutes. AFTER THE PROCEDURE If a local anesthetic is used, you will be allowed to go home as soon as you are ready. If a general anesthetic is used, you will be taken to a recovery area until you are stable. You may have temporary bleeding and burning on urination.   This information is not intended to replace advice given to you by your health care provider. Make sure you discuss any questions you have with your health care provider.   Document Released: 12/02/2000 Document Revised: 12/26/2014 Document Reviewed: 05/28/2012 Elsevier Interactive Patient Education 2016 [employer]. PATIENT INSTRUCTIONS  POST-ANESTHESIA  IMMEDIATELY FOLLOWING SURGERY:  Do not drive or operate machinery for the first twenty four hours after surgery.  Do not make any important decisions for twenty four hours after surgery or while taking narcotic pain medications or sedatives.  If you develop intractable nausea and vomiting or a severe headache please notify your doctor immediately.  FOLLOW-UP:  Please make an appointment with your surgeon as instructed. You do not need to follow up with anesthesia unless specifically instructed to do so.  WOUND CARE INSTRUCTIONS (if applicable):  Keep a dry clean dressing on the anesthesia/puncture  wound site if there is drainage.  Once the wound has quit draining you may leave it open to air.  Generally you should leave the bandage intact for twenty four hours unless there is drainage.  If the epidural site drains for more than 36-48 hours please call the anesthesia department.  QUESTIONS?:  Please feel free to call your physician or the hospital operator if you have any questions, and they will be happy to assist you.

## 2015-12-15 ENCOUNTER — Encounter (HOSPITAL_COMMUNITY): Payer: Self-pay

## 2015-12-15 ENCOUNTER — Encounter (HOSPITAL_COMMUNITY)
Admission: RE | Admit: 2015-12-15 | Discharge: 2015-12-15 | Disposition: A | Discharge status: Home / Self Care | Payer: Medicaid Other | Source: Ambulatory Visit | Attending: Urology | Admitting: Urology

## 2015-12-15 DIAGNOSIS — R3989 Other symptoms and signs involving the genitourinary system: Secondary | ICD-10-CM | POA: Diagnosis not present

## 2015-12-15 DIAGNOSIS — Z01818 Encounter for other preprocedural examination: Secondary | ICD-10-CM | POA: Insufficient documentation

## 2015-12-15 HISTORY — DX: Retention of urine, unspecified: R33.9

## 2015-12-15 HISTORY — DX: Urgency of urination: R39.15

## 2015-12-15 HISTORY — DX: Plantar fascial fibromatosis: M72.2

## 2015-12-15 LAB — BASIC METABOLIC PANEL
ANION GAP: 6 (ref 5–15)
BUN: 15 mg/dL (ref 6–20)
CALCIUM: 9.3 mg/dL (ref 8.9–10.3)
CO2: 23 mmol/L (ref 22–32)
CREATININE: 0.86 mg/dL (ref 0.44–1.00)
Chloride: 110 mmol/L (ref 101–111)
Glucose, Bld: 104 mg/dL — ABNORMAL HIGH (ref 65–99)
Potassium: 4.3 mmol/L (ref 3.5–5.1)
SODIUM: 139 mmol/L (ref 135–145)

## 2015-12-15 LAB — CBC
HCT: 39.6 % (ref 36.0–46.0)
Hemoglobin: 13.2 g/dL (ref 12.0–15.0)
MCH: 29.5 pg (ref 26.0–34.0)
MCHC: 33.3 g/dL (ref 30.0–36.0)
MCV: 88.4 fL (ref 78.0–100.0)
PLATELETS: 213 10*3/uL (ref 150–400)
RBC: 4.48 MIL/uL (ref 3.87–5.11)
RDW: 13.1 % (ref 11.5–15.5)
WBC: 5.6 10*3/uL (ref 4.0–10.5)

## 2015-12-15 LAB — HCG, SERUM, QUALITATIVE: Preg, Serum: NEGATIVE

## 2015-12-18 ENCOUNTER — Ambulatory Visit (HOSPITAL_COMMUNITY)
Admission: RE | Admit: 2015-12-18 | Discharge: 2015-12-18 | Disposition: A | Discharge status: Home / Self Care | Payer: Medicaid Other | Source: Ambulatory Visit | Attending: Urology | Admitting: Urology

## 2015-12-18 ENCOUNTER — Encounter (HOSPITAL_COMMUNITY): Payer: Self-pay | Admitting: *Deleted

## 2015-12-18 ENCOUNTER — Ambulatory Visit (HOSPITAL_COMMUNITY): Payer: Medicaid Other | Admitting: Anesthesiology

## 2015-12-18 ENCOUNTER — Encounter (HOSPITAL_COMMUNITY)
Admission: RE | Disposition: A | Discharge status: Home / Self Care | Payer: Self-pay | Source: Ambulatory Visit | Attending: Urology

## 2015-12-18 DIAGNOSIS — Z9049 Acquired absence of other specified parts of digestive tract: Secondary | ICD-10-CM | POA: Insufficient documentation

## 2015-12-18 DIAGNOSIS — R350 Frequency of micturition: Secondary | ICD-10-CM | POA: Insufficient documentation

## 2015-12-18 DIAGNOSIS — I10 Essential (primary) hypertension: Secondary | ICD-10-CM | POA: Insufficient documentation

## 2015-12-18 DIAGNOSIS — K219 Gastro-esophageal reflux disease without esophagitis: Secondary | ICD-10-CM | POA: Insufficient documentation

## 2015-12-18 DIAGNOSIS — J45909 Unspecified asthma, uncomplicated: Secondary | ICD-10-CM | POA: Diagnosis not present

## 2015-12-18 DIAGNOSIS — Z87891 Personal history of nicotine dependence: Secondary | ICD-10-CM | POA: Insufficient documentation

## 2015-12-18 DIAGNOSIS — F329 Major depressive disorder, single episode, unspecified: Secondary | ICD-10-CM | POA: Insufficient documentation

## 2015-12-18 DIAGNOSIS — Z801 Family history of malignant neoplasm of trachea, bronchus and lung: Secondary | ICD-10-CM | POA: Diagnosis not present

## 2015-12-18 DIAGNOSIS — N3010 Interstitial cystitis (chronic) without hematuria: Secondary | ICD-10-CM | POA: Insufficient documentation

## 2015-12-18 DIAGNOSIS — M797 Fibromyalgia: Secondary | ICD-10-CM | POA: Diagnosis not present

## 2015-12-18 DIAGNOSIS — R3989 Other symptoms and signs involving the genitourinary system: Secondary | ICD-10-CM | POA: Diagnosis not present

## 2015-12-18 DIAGNOSIS — F419 Anxiety disorder, unspecified: Secondary | ICD-10-CM | POA: Diagnosis not present

## 2015-12-18 DIAGNOSIS — R300 Dysuria: Secondary | ICD-10-CM | POA: Diagnosis not present

## 2015-12-18 DIAGNOSIS — Z8051 Family history of malignant neoplasm of kidney: Secondary | ICD-10-CM | POA: Insufficient documentation

## 2015-12-18 HISTORY — PX: CYSTO WITH HYDRODISTENSION: SHX5453

## 2015-12-18 SURGERY — CYSTOSCOPY, WITH BLADDER HYDRODISTENSION
Anesthesia: General

## 2015-12-18 MED ORDER — MIDAZOLAM HCL 2 MG/2ML IJ SOLN
INTRAMUSCULAR | Status: AC
  Filled 2015-12-18: qty 2

## 2015-12-18 MED ORDER — LIDOCAINE HCL (PF) 1 % IJ SOLN
INTRAMUSCULAR | Status: AC
  Filled 2015-12-18: qty 5

## 2015-12-18 MED ORDER — PROPOFOL 10 MG/ML IV BOLUS
INTRAVENOUS | Status: DC | PRN
  Administered 2015-12-18: 150 mg via INTRAVENOUS

## 2015-12-18 MED ORDER — ONDANSETRON HCL 4 MG/2ML IJ SOLN
4.0000 mg | Freq: Once | INTRAMUSCULAR | Status: AC
  Administered 2015-12-18: 4 mg via INTRAVENOUS

## 2015-12-18 MED ORDER — GLYCOPYRROLATE 0.2 MG/ML IJ SOLN
0.2000 mg | Freq: Once | INTRAMUSCULAR | Status: AC
  Administered 2015-12-18: 0.2 mg via INTRAVENOUS

## 2015-12-18 MED ORDER — BUPIVACAINE HCL (PF) 0.5 % IJ SOLN
Freq: Once | INTRAMUSCULAR | Status: DC
  Filled 2015-12-18: qty 15

## 2015-12-18 MED ORDER — LIDOCAINE HCL (CARDIAC) 20 MG/ML IV SOLN
INTRAVENOUS | Status: DC | PRN
  Administered 2015-12-18: 40 mg via INTRATRACHEAL

## 2015-12-18 MED ORDER — ONDANSETRON HCL 4 MG/2ML IJ SOLN
4.0000 mg | Freq: Once | INTRAMUSCULAR | Status: AC | PRN
  Administered 2015-12-18: 4 mg via INTRAVENOUS
  Filled 2015-12-18: qty 2

## 2015-12-18 MED ORDER — PHENAZOPYRIDINE HCL 200 MG PO TABS
ORAL | Status: DC | PRN
  Administered 2015-12-18: 15 mL via INTRAVESICAL

## 2015-12-18 MED ORDER — FENTANYL CITRATE (PF) 100 MCG/2ML IJ SOLN
25.0000 ug | INTRAMUSCULAR | Status: DC | PRN
  Administered 2015-12-18: 50 ug via INTRAVENOUS
  Filled 2015-12-18: qty 2

## 2015-12-18 MED ORDER — DEXAMETHASONE SODIUM PHOSPHATE 4 MG/ML IJ SOLN
4.0000 mg | Freq: Once | INTRAMUSCULAR | Status: AC
  Administered 2015-12-18: 4 mg via INTRAVENOUS

## 2015-12-18 MED ORDER — SCOPOLAMINE 1 MG/3DAYS TD PT72
1.0000 | MEDICATED_PATCH | Freq: Once | TRANSDERMAL | Status: DC
  Administered 2015-12-18: 1.5 mg via TRANSDERMAL

## 2015-12-18 MED ORDER — DEXAMETHASONE SODIUM PHOSPHATE 4 MG/ML IJ SOLN
INTRAMUSCULAR | Status: AC
  Filled 2015-12-18: qty 1

## 2015-12-18 MED ORDER — PHENAZOPYRIDINE HCL 200 MG PO TABS: 200.0000 mg | ORAL_TABLET | Freq: Three times a day (TID) | ORAL | Status: DC | PRN

## 2015-12-18 MED ORDER — GLYCOPYRROLATE 0.2 MG/ML IJ SOLN
INTRAMUSCULAR | Status: AC
  Filled 2015-12-18: qty 1

## 2015-12-18 MED ORDER — SODIUM CHLORIDE 0.9 % IV SOLN: 250.0000 mL | INTRAVENOUS | Status: DC | PRN

## 2015-12-18 MED ORDER — FENTANYL CITRATE (PF) 100 MCG/2ML IJ SOLN
25.0000 ug | INTRAMUSCULAR | Status: AC
  Administered 2015-12-18 (×2): 25 ug via INTRAVENOUS

## 2015-12-18 MED ORDER — FENTANYL CITRATE (PF) 100 MCG/2ML IJ SOLN
INTRAMUSCULAR | Status: AC
  Filled 2015-12-18: qty 2

## 2015-12-18 MED ORDER — STERILE WATER FOR IRRIGATION IR SOLN
Status: DC | PRN
  Administered 2015-12-18: 1000 mL
  Administered 2015-12-18: 3000 mL

## 2015-12-18 MED ORDER — ONDANSETRON HCL 4 MG/2ML IJ SOLN
INTRAMUSCULAR | Status: AC
  Filled 2015-12-18: qty 2

## 2015-12-18 MED ORDER — LACTATED RINGERS IV SOLN
INTRAVENOUS | Status: DC
  Administered 2015-12-18: 1000 mL via INTRAVENOUS

## 2015-12-18 MED ORDER — SODIUM CHLORIDE 0.9 % IJ SOLN: 3.0000 mL | INTRAMUSCULAR | Status: DC | PRN

## 2015-12-18 MED ORDER — HYDROCODONE-ACETAMINOPHEN 5-325 MG PO TABS: 1.0000 | ORAL_TABLET | Freq: Four times a day (QID) | ORAL | Status: DC | PRN

## 2015-12-18 MED ORDER — FENTANYL CITRATE (PF) 100 MCG/2ML IJ SOLN: 25.0000 ug | INTRAMUSCULAR | Status: DC | PRN

## 2015-12-18 MED ORDER — ACETAMINOPHEN 325 MG PO TABS: 650.0000 mg | ORAL_TABLET | ORAL | Status: DC | PRN

## 2015-12-18 MED ORDER — PROPOFOL 10 MG/ML IV BOLUS
INTRAVENOUS | Status: AC
  Filled 2015-12-18: qty 20

## 2015-12-18 MED ORDER — MIDAZOLAM HCL 2 MG/2ML IJ SOLN
1.0000 mg | INTRAMUSCULAR | Status: DC | PRN
  Administered 2015-12-18: 2 mg via INTRAVENOUS

## 2015-12-18 MED ORDER — FENTANYL CITRATE (PF) 100 MCG/2ML IJ SOLN
INTRAMUSCULAR | Status: DC | PRN
  Administered 2015-12-18 (×2): 25 ug via INTRAVENOUS
  Administered 2015-12-18 (×2): 50 ug via INTRAVENOUS

## 2015-12-18 MED ORDER — ACETAMINOPHEN 650 MG RE SUPP
650.0000 mg | RECTAL | Status: DC | PRN
  Filled 2015-12-18: qty 1

## 2015-12-18 MED ORDER — OXYCODONE HCL 5 MG PO TABS: 5.0000 mg | ORAL_TABLET | ORAL | Status: DC | PRN

## 2015-12-18 MED ORDER — SODIUM CHLORIDE 0.9 % IJ SOLN: 3.0000 mL | Freq: Two times a day (BID) | INTRAMUSCULAR | Status: DC

## 2015-12-18 MED ORDER — SCOPOLAMINE 1 MG/3DAYS TD PT72
MEDICATED_PATCH | TRANSDERMAL | Status: AC
  Filled 2015-12-18: qty 1

## 2015-12-18 MED ORDER — CIPROFLOXACIN IN D5W 400 MG/200ML IV SOLN
400.0000 mg | INTRAVENOUS | Status: AC
  Administered 2015-12-18: 400 mg via INTRAVENOUS
  Filled 2015-12-18: qty 200

## 2015-12-18 SURGICAL SUPPLY — 26 items
BAG DRAIN URO TABLE W/ADPT NS (DRAPE) ×3 IMPLANT
BAG DRN 8 ADPR NS SKTRN CSTL (DRAPE) ×1
BAG HAMPER (MISCELLANEOUS) ×3 IMPLANT
CATH ROBINSON RED A/P 16FR (CATHETERS) ×3 IMPLANT
CATH ROBINSON RED A/P 18FR (CATHETERS) IMPLANT
CLOTH BEACON ORANGE TIMEOUT ST (SAFETY) ×4 IMPLANT
GLOVE BIO SURGEON STRL SZ7.5 (GLOVE) ×2 IMPLANT
GLOVE BIOGEL PI IND STRL 7.0 (GLOVE) IMPLANT
GLOVE BIOGEL PI IND STRL 7.5 (GLOVE) IMPLANT
GLOVE BIOGEL PI INDICATOR 7.0 (GLOVE) ×2
GLOVE BIOGEL PI INDICATOR 7.5 (GLOVE) ×2
GLOVE SURG SS PI 8.0 STRL IVOR (GLOVE) ×3 IMPLANT
GOWN STRL REUS W/TWL LRG LVL3 (GOWN DISPOSABLE) ×3 IMPLANT
KIT ROOM TURNOVER AP CYSTO (KITS) ×3 IMPLANT
MANIFOLD NEPTUNE II (INSTRUMENTS) ×3 IMPLANT
NDL SAFETY ECLIPSE 18X1.5 (NEEDLE) IMPLANT
NEEDLE HYPO 18GX1.5 SHARP (NEEDLE)
NS IRRIG 1000ML POUR BTL (IV SOLUTION) ×3 IMPLANT
PACK CYSTO (CUSTOM PROCEDURE TRAY) ×3 IMPLANT
PAD ARMBOARD 7.5X6 YLW CONV (MISCELLANEOUS) ×3 IMPLANT
PLUG CATH AND CAP STER (CATHETERS) IMPLANT
SYR 30ML LL (SYRINGE) IMPLANT
SYRINGE IRR TOOMEY STRL 70CC (SYRINGE) ×3 IMPLANT
TOWEL OR 17X24 6PK STRL BLUE (TOWEL DISPOSABLE) ×1 IMPLANT
TOWEL OR 17X26 4PK STRL BLUE (TOWEL DISPOSABLE) ×3 IMPLANT
WATER STERILE IRR 3000ML UROMA (IV SOLUTION) ×4 IMPLANT

## 2015-12-18 NOTE — H&P (Signed)
Active Problems  1. Bladder pain (R39.89)  2. Dysuria (R30.0)  3. Urinary frequency (R35.0)  History of Present Illness  Kiara Dillon is a 34 yo WF who is sent by Dr. Luellen Pucker for abdominal pain and abdominal swelling.  She can't sleep on the left side because of stomach pain.  She has found that the only way she can relieve it is by voiding.   She notes almost a lump on the left abdomen when it swells.  She has had prior UTI's and her symptoms are not like that but she has to lean over and push to voiding and may void some after wiping.  She feels like her bladder is full all the time with pressure.   She has frequency 15-20x daily and nocturia x 3.  She has no incontinence or dysuria.  She has had no gross hematuria but she has had microhematuria that has been chronic.  She has had no stones or GU surgery.  She has had rare UTI's and none recently.  She is G1P1 with a NVD.  She has some dysparunia with anterior pain.  She has no prolapse symptoms.   Past Medical History  1. History of Anxiety (F41.9)  2. History of Gastroparesis (K31.84)  3. History of asthma (Z87.09)  4. History of depression (Z86.59)  5. History of esophageal reflux (Z87.19)  6. History of fibromyalgia (Z87.39)  7. History of hypertension (Z86.79)  Surgical History  1. History of Cholecystectomy  Current Meds  1. Promethazine HCl - 25 MG Oral Tablet;  Therapy: (Recorded:09Dec2016) to Recorded  2. Viberzi 75 MG Oral Tablet;  Therapy: (Recorded:09Dec2016) to Recorded  Allergies  1. Penicillins  Family History  1. Family history of kidney cancer (Z80.51) : Maternal Grandfather  2. Family history of lung cancer (Z80.1) : Maternal Grandfather, Paternal Grandfather  3. Family history of myocardial infarction (Z82.49) : Paternal Grandmother  4. Family history of polymyositis (Z82.69) : Maternal Grandmother  Social History   Caffeine use (F15.90)   1 p/d   Former tobacco use (Z87.891)   1/2 pk. p/d for 3 yrs. quit 1  yr.   Number of children   1 girl   Occupation   massage therapist   Single  Review of Systems Genitourinary, constitutional, skin, eye, otolaryngeal, hematologic/lymphatic, cardiovascular, pulmonary, endocrine, musculoskeletal, gastrointestinal, neurological and psychiatric system(s) were reviewed and pertinent findings if present are noted and are otherwise negative.  Genitourinary: urinary frequency, urinary urgency, dysuria, nocturia, urinary hesitancy, urinary stream starts and stops, dyspareunia and initiating urination requires straining, but no hematuria.  Gastrointestinal: nausea, vomiting, heartburn and diarrhea.  Constitutional: night sweats and feeling tired (fatigue).  Integumentary: skin rash/lesion.  ENT: sinus problems.  Hematologic/Lymphatic: swollen neck glands.  Cardiovascular: leg swelling.  Musculoskeletal: back pain and joint pain (she has plantar fasciatis).  Neurological: headache.  Psychiatric: depression and anxiety.    Vitals Vital Signs [Data Includes: Last 1 Day]  Recorded: 09Dec2016 10:51AM  Height: 5 ft 2.5 in Weight: 170 lb  BMI Calculated: 30.6 BSA Calculated: 1.79 Recorded: 09Dec2016 10:47AM  Blood Pressure: 118 / 71 Temperature: 97.7 F Heart Rate: 84  Physical Exam Constitutional: Well nourished and well developed . No acute distress.  ENT:. The ears and nose are normal in appearance.  Neck: The appearance of the neck is normal and no neck mass is present.  Pulmonary: No respiratory distress and normal respiratory rhythm and effort.  Cardiovascular: Heart rate and rhythm are normal . No peripheral edema.  Abdomen: The  abdomen is mildly obese. No masses are palpated. The abdomen is not firm, not rigid, no rebound and no guarding. Mild. Tenderness is present diffusely throughout the abdomen. No hernias are palpable.  No umbilical hernia.  No inguinal hernia is present on the right.  No inguinal hernia is present on the left. Her abdominal  wall is lax and she is able to puff it up with straining but there are no specific abdominal wall defects.  Genitourinary:   Examination of the external genitalia shows normal female external genitalia. The urethra is normal in appearance. Urethral hypermobility is present. (mild). Vaginal exam demonstrates a scant and mucoid vaginal discharge and tenderness (circumferential but worst over the bladder. There is no cervical motion tenderness. ), but no atrophy and no uterine prolapse. No cystocele is identified. No rectocele is identified. The cervix is is without abnormalities. The uterus is without abnormalities. The adnexa are palpably normal. The bladder is tender, but normal on palpation and not distended. Postvoid residual urine is 35 mL. The anus is normal on inspection. The perineum is normal on inspection.  Lymphatics: The femoral and inguinal nodes are not enlarged or tender.  Skin: Normal skin turgor, no visible rash and no visible skin lesions.  Neuro/Psych:. Mood and affect are appropriate. Normal sensation of the perineum/perianal region (S3,4,5).    Results/Data  Old records or history reviewed: I have reviewed records from Dr. Luellen PuckerSams.  The following images/tracing/specimen were independently visualized:  I have reviewed her most recent CT films and see no GU findings.  The following clinical lab reports were reviewed:  UA is clear.  PVR: Ultrasound PVR 35 ml. Selected Results  UA With REFLEX 09Dec2016 10:49AM Bjorn PippinWrenn, [name]  [id number]   Test Name Result Flag Reference  COLOR YELLOW  YELLOW  ** PLEASE NOTE CHANGE IN UNIT OF MEASURE AND REFERENCE RANGE(S). **  APPEARANCE CLEAR  CLEAR  SPECIFIC GRAVITY 1.020  1.001-1.035  pH 5.0  5.0-8.0  GLUCOSE NEGATIVE  NEGATIVE  BILIRUBIN NEGATIVE  NEGATIVE  KETONE NEGATIVE  NEGATIVE  BLOOD NEGATIVE  NEGATIVE  PROTEIN NEGATIVE  NEGATIVE  NITRITE NEGATIVE  NEGATIVE  LEUKOCYTE ESTERASE NEGATIVE  NEGATIVE   Assessment  1. Urinary frequency  (R35.0)  2. Bladder pain (R39.89)   I can't explain all of her abdominal pain but her bladder is tender and she has marked frequency and could have interstitial cystitis.   Plan Dysuria   1. Pelvic Exam; Status:Hold For - Manual Activation; Requested for:09Dec2016;   2. PVR U/S; Status:Hold For - Appointment,Date of Service; Requested for:09Dec2016;   3. UA With REFLEX; [Do Not Release]; Status:Hold For - [employer]imen/Data Collection;  Requested for:09Dec2016;    I discussed the options for therapy including a trial of meds vs cystoscopy and HOD. She would like to go with the cystoscopy and HOD and I reviewed the risks of bleeding, infection, bladder injury, thrombotic events, retention and anesthetic complications.  I discussed dietary irritants as well.   Discussion/Summary     CC: Talmage NapAmanda Sams NP at [facility] and Elfredia NevinsLawrence Fusco.

## 2015-12-18 NOTE — Transfer of Care (Signed)
Immediate Anesthesia Transfer of Care Note  Patient: Kiara Dillon  Procedure(s) Performed: Procedure(s): CYSTOSCOPY/HYDRODISTENSION - INSTILL PYRIDIUM AND MARCAINE (N/A)  Patient Location: PACU  Anesthesia Type:General  Level of Consciousness: awake  Airway & Oxygen Therapy: Patient Spontanous Breathing and Patient connected to face mask oxygen  Post-op Assessment: Report given to RN  Post vital signs: Reviewed and stable  Last Vitals:  Filed Vitals:   12/18/15 1140 12/18/15 1145  BP: 109/72 109/72  Temp:    Resp: 22 23    Complications: No apparent anesthesia complications

## 2015-12-18 NOTE — Discharge Instructions (Signed)
Interstitial Cystitis °Interstitial cystitis is a condition that causes inflammation of the bladder. The bladder is a hollow organ in the lower part of your abdomen. It stores urine after the urine is made by your kidneys. With interstitial cystitis, you may have pain in the bladder area. You may also have a frequent and urgent need to urinate. °The severity of interstitial cystitis can vary from person to person. You may have flare-ups of the condition, and then it may go away for a while. For many people who have this condition, it becomes a long-term problem. °CAUSES °The cause of this condition is not known. °RISK FACTORS °This condition is more likely to develop in women. °SYMPTOMS °Symptoms of interstitial cystitis vary, and they can change over time. Symptoms may include: °· Discomfort or pain in the bladder area. This can range from mild to severe. The pain may change in intensity as the bladder fills with urine or as it empties. °· Pelvic pain. °· An urgent need to urinate. °· Frequent urination. °· Pain during sexual intercourse. °· Pinpoint bleeding on the bladder wall. °For women, the symptoms often get worse during menstruation. °DIAGNOSIS °This condition is diagnosed by evaluating your symptoms and ruling out other causes. A physical exam will be done. Various tests may be done to rule out other conditions. Common tests include: °· Urine tests. °· Cystoscopy. In this test, a tool that is like a very thin telescope is used to look into your bladder. °· Biopsy. This involves taking a sample of tissue from the bladder wall to be examined under a microscope. °TREATMENT °There is no cure for interstitial cystitis, but treatment methods are available to control your symptoms. Work closely with your health care provider to find the treatments that will be most effective for you. Treatment options may include: °· Medicines to relieve pain and to help reduce the number of times that you feel the need to  urinate. °· Bladder training. This involves learning ways to control when you urinate, such as: °¨ Urinating at scheduled times. °¨ Training yourself to delay urination. °¨ Doing exercises (Kegel exercises) to strengthen the muscles that control urine flow. °· Lifestyle changes, such as changing your diet or taking steps to control stress. °· Use of a device that provides electrical stimulation in order to reduce pain. °· A procedure that stretches your bladder by filling it with air or fluid. °· Surgery. This is rare. It is only done for extreme cases if other treatments do not help. °HOME CARE INSTRUCTIONS °· Take medicines only as directed by your health care provider. °· Use bladder training techniques as directed. °¨ Keep a bladder diary to find out which foods, liquids, or activities make your symptoms worse. °¨ Use your bladder diary to schedule bathroom trips. If you are away from home, plan to be near a bathroom at each of your scheduled times. °¨ Make sure you urinate just before you leave the house and just before you go to bed. °· Do Kegel exercises as directed by your health care provider. °· Do not drink alcohol. °· Do not use any tobacco products, including cigarettes, chewing tobacco, or electronic cigarettes. If you need help quitting, ask your health care provider. °· Make dietary changes as directed by your health care provider. You may need to avoid spicy foods and foods that contain a high amount of potassium. °· Limit your drinking of beverages that stimulate urination. These include soda, coffee, and tea. °· Keep all follow-up   visits as directed by your health care provider. This is important. SEEK MEDICAL CARE IF:  Your symptoms do not get better after treatment.  Your pain and discomfort are getting worse.  You have more frequent urges to urinate.  You have a fever. SEEK IMMEDIATE MEDICAL CARE IF:  You are not able to control your bladder at all.   This information is not  intended to replace advice given to you by your health care provider. Make sure you discuss any questions you have with your health care provider.   Document Released: 08/05/2004 Document Revised: 12/26/2014 Document Reviewed: 08/12/2014 Elsevier Interactive Patient Education 2016 [employer]   CYSTOSCOPY HOME CARE INSTRUCTIONS  Activity: Rest for the remainder of the day.  Do not drive or operate equipment today.  You may resume normal activities in one to two days as instructed by your physician.   Meals: Drink plenty of liquids and eat light foods such as gelatin or soup this evening.  You may return to a normal meal plan tomorrow.  Return to Work: You may return to work in one to two days or as instructed by your physician.  Special Instructions / Symptoms: Call your physician if any of these symptoms occur:   -persistent or heavy bleeding  -bleeding which continues after first few urination  -large blood clots that are difficult to pass  -urine stream diminishes or stops completely  -fever equal to or higher than 101 degrees Farenheit.  -cloudy urine with a strong, foul odor  -severe pain  Females should always wipe from front to back after elimination.  You may feel some burning pain when you urinate.  This should disappear with time.  Applying moist heat to the lower abdomen or a hot tub bath may help relieve the pain. \    Patient Signature:  ________________________________________________________  Nurse's Signature:  ________________________________________________________     PATIENT INSTRUCTIONS POST-ANESTHESIA  IMMEDIATELY FOLLOWING SURGERY:  Do not drive or operate machinery for the first twenty four hours after surgery.  Do not make any important decisions for twenty four hours after surgery or while taking narcotic pain medications or sedatives.  If you develop intractable nausea and vomiting or a severe headache please notify your doctor  immediately.  FOLLOW-UP:  Please make an appointment with your surgeon as instructed. You do not need to follow up with anesthesia unless specifically instructed to do so.  WOUND CARE INSTRUCTIONS (if applicable):  Keep a dry clean dressing on the anesthesia/puncture wound site if there is drainage.  Once the wound has quit draining you may leave it open to air.  Generally you should leave the bandage intact for twenty four hours unless there is drainage.  If the epidural site drains for more than 36-48 hours please call the anesthesia department.  QUESTIONS?:  Please feel free to call your physician or the hospital operator if you have any questions, and they will be happy to assist you.

## 2015-12-18 NOTE — Interval H&P Note (Signed)
History and Physical Interval Note:  She now reports some incontinence.  12/18/2015 11:45 AM  Kiara Dillon  has presented today for surgery, with the diagnosis of painful bladder  The various methods of treatment have been discussed with the patient and family. After consideration of risks, benefits and other options for treatment, the patient has consented to  Procedure(s): CYSTOSCOPY/HYDRODISTENSION - INSTILL PYRIDIUM AND MARCAINE (N/A) as a surgical intervention .  The patient's history has been reviewed, patient examined, no change in status, stable for surgery.  I have reviewed the patient's chart and labs.  Questions were answered to the patient's satisfaction.     [name] J

## 2015-12-18 NOTE — Anesthesia Preprocedure Evaluation (Signed)
Anesthesia Evaluation  Patient identified by MRN, date of birth, ID band Patient awake    Reviewed: Allergy & Precautions, H&P , NPO status , Patient's Chart, lab work & pertinent test results  History of Anesthesia Complications (+) PONV and history of anesthetic complications  Airway Mallampati: II  TM Distance: >3 FB Neck ROM: Full    Dental  (+) Teeth Intact   Pulmonary asthma , Current Smoker, former smoker,    breath sounds clear to auscultation       Cardiovascular hypertension, Pt. on medications  Rhythm:Regular Rate:Normal     Neuro/Psych    GI/Hepatic GERD  Controlled and Medicated,  Endo/Other    Renal/GU      Musculoskeletal   Abdominal   Peds  Hematology   Anesthesia Other Findings   Reproductive/Obstetrics                             Anesthesia Physical Anesthesia Plan  ASA: II  Anesthesia Plan: General   Post-op Pain Management:    Induction: Intravenous, Rapid sequence and Cricoid pressure planned  Airway Management Planned: Oral ETT  Additional Equipment:   Intra-op Plan:   Post-operative Plan: Extubation in OR  Informed Consent: I have reviewed the patients History and Physical, chart, labs and discussed the procedure including the risks, benefits and alternatives for the proposed anesthesia with the patient or authorized representative who has indicated his/her understanding and acceptance.     Plan Discussed with:   Anesthesia Plan Comments: (Albuterol pre-op)        Anesthesia Quick Evaluation

## 2015-12-18 NOTE — Anesthesia Postprocedure Evaluation (Signed)
Anesthesia Post Note  Patient: Kiara Dillon  Procedure(s) Performed: Procedure(s) (LRB): CYSTOSCOPY/HYDRODISTENSION - INSTILL PYRIDIUM AND MARCAINE (N/A)  Patient location during evaluation: PACU Anesthesia Type: General Level of consciousness: awake and alert and oriented Pain management: pain level controlled Vital Signs Assessment: post-procedure vital signs reviewed and stable Respiratory status: spontaneous breathing Cardiovascular status: blood pressure returned to baseline Postop Assessment: adequate PO intake Anesthetic complications: no    Last Vitals:  Filed Vitals:   12/18/15 1315 12/18/15 1330  BP: 102/64   Pulse: 63 62  Temp:    Resp: 11 12    Last Pain:  Filed Vitals:   12/18/15 1333  PainSc: 4                  Vernita Tague

## 2015-12-18 NOTE — Brief Op Note (Signed)
12/18/2015  12:26 PM  PATIENT:  Kiara Dillon  34 y.o. female  PRE-OPERATIVE DIAGNOSIS:  painful bladder  POST-OPERATIVE DIAGNOSIS:  interstitial cystitis  PROCEDURE:  Procedure(s): CYSTOSCOPY/HYDRODISTENSION - INSTILL PYRIDIUM AND MARCAINE (N/A)  SURGEON:  Surgeon(s) and Role:    * Bjorn PippinJohn Cleto Claggett, MD - Primary  PHYSICIAN ASSISTANT:   ASSISTANTS: none   ANESTHESIA:   general  EBL:     BLOOD ADMINISTERED:none  DRAINS: none   LOCAL MEDICATIONS USED:  MARCAINE    and Amount: 30 ml  SPECIMEN:  No Specimen  DISPOSITION OF SPECIMEN:  N/A  COUNTS:  YES  TOURNIQUET:  * No tourniquets in log *  DICTATION: .Other Dictation: Dictation Number [id number]  PLAN OF CARE: Discharge to home after PACU  PATIENT DISPOSITION:  PACU - hemodynamically stable.   Delay start of Pharmacological VTE agent (>24hrs) due to surgical blood loss or risk of bleeding: not applicable

## 2015-12-18 NOTE — Anesthesia Procedure Notes (Signed)
Procedure Name: LMA Insertion Date/Time: 12/18/2015 12:09 PM Performed by: Glynn OctaveANIEL, [name] Pre-anesthesia Checklist: Patient identified, Patient being monitored, Emergency Drugs available, Timeout performed and Suction available Patient Re-evaluated:Patient Re-evaluated prior to inductionOxygen Delivery Method: Circle System Utilized Preoxygenation: Pre-oxygenation with 100% oxygen Intubation Type: IV induction Ventilation: Mask ventilation without difficulty LMA: LMA inserted LMA Size: 4.0 Number of attempts: 1 Placement Confirmation: positive ETCO2 and breath sounds checked- equal and bilateral

## 2015-12-21 NOTE — Op Note (Signed)
NAME:  Aurelio BrashBROWN, Pihu                    ACCOUNT NO.:  MEDICAL RECORD NO.:  [id number]  LOCATION:                                 FACILITY:  PHYSICIAN:  Excell SeltzerJohn J. Annabell HowellsWrenn, M.D.    DATE OF BIRTH:  [birth year]  DATE OF PROCEDURE:  12/18/2015 DATE OF DISCHARGE:                              OPERATIVE REPORT   PROCEDURE:  Cystoscopy with hydrodistention of the bladder and installation of Pyridium and Marcaine.  PREOPERATIVE DIAGNOSIS:  Painful bladder.  POSTOPERATIVE DIAGNOSIS:  Painful bladder with interstitial cystitis.  SURGEON:  Excell SeltzerJohn J. Annabell HowellsWrenn, MD  ANESTHESIA:  General.  SPECIMEN:  None.  DRAIN:  None.  BLOOD LOSS:  None.  COMPLICATIONS:  None.  INDICATIONS:  Ms. Manson PasseyBrown is a 35 year old white female with painful bladder who is to undergo hydrodistention to assess for interstitial cystitis.  FINDINGS OF PROCEDURE:  She was taken to the operating room where she was given Cipro.  A general anesthetic was induced.  She was placed in lithotomy position, was fitted with PAS hose.  Her perineum and genitalia were prepped with Betadine solution.  She was draped in usual sterile fashion.  Cystoscopy was performed using the 23-French scope and 30-degree lens. Examination revealed a normal urethra.  The bladder wall was smooth and pale without tumor, stones, or inflammation.  There was some squamous metaplasia of the trigone.  The ureteral orifices were unremarkable.  After initial cystoscopy, the bladder was filled to capacity under 80 cm of water pressure and then drained.  Her capacity under anesthesia was 750 mL and during drainage, she was observed to have formed diffuse glomerulations, they were most concentrated in the trigone above the area of squamous metaplasia.  Once the bladder was drained, it was refilled partially and the scope was removed.  Pressure on the bladder produced no leakage despite the patient's recent complaint of incontinence.  At this point, the  bladder was drained.  A 16-French red rubber catheter was inserted and she was instilled with 30 mL of 0.25% Marcaine with 400 mg of crushed Pyridium.  The catheter was then removed.  The patient was taken down from lithotomy position.  Her anesthetic was reversed.  She was moved to recovery room in stable condition.  There were no complications.     Excell SeltzerJohn J. Annabell HowellsWrenn, M.D.     JJW/MEDQ  D:  12/18/2015  T:  12/18/2015  Job:  [id number]

## 2015-12-22 ENCOUNTER — Encounter (HOSPITAL_COMMUNITY): Payer: Self-pay | Admitting: Urology

## 2016-01-01 ENCOUNTER — Ambulatory Visit (INDEPENDENT_AMBULATORY_CARE_PROVIDER_SITE_OTHER): Payer: Medicaid Other | Admitting: Urology

## 2016-01-01 DIAGNOSIS — R350 Frequency of micturition: Secondary | ICD-10-CM | POA: Diagnosis not present

## 2016-01-01 DIAGNOSIS — N3010 Interstitial cystitis (chronic) without hematuria: Secondary | ICD-10-CM | POA: Diagnosis not present

## 2016-01-01 DIAGNOSIS — R3989 Other symptoms and signs involving the genitourinary system: Secondary | ICD-10-CM

## 2016-01-01 DIAGNOSIS — N393 Stress incontinence (female) (male): Secondary | ICD-10-CM | POA: Diagnosis not present

## 2016-01-30 NOTE — Progress Notes (Signed)
REVIEWED-NO ADDITIONAL RECOMMENDATIONS. 

## 2016-02-05 ENCOUNTER — Ambulatory Visit: Payer: Medicaid Other | Admitting: Urology

## 2016-04-06 ENCOUNTER — Encounter: Payer: Self-pay | Admitting: Internal Medicine

## 2016-04-07 ENCOUNTER — Encounter: Payer: Self-pay | Admitting: *Deleted

## 2016-04-12 ENCOUNTER — Encounter: Payer: Self-pay | Admitting: Adult Health

## 2016-04-12 ENCOUNTER — Other Ambulatory Visit: Payer: No Typology Code available for payment source | Admitting: Adult Health

## 2017-07-12 ENCOUNTER — Ambulatory Visit (INDEPENDENT_AMBULATORY_CARE_PROVIDER_SITE_OTHER): Payer: Medicaid Other | Admitting: Orthopaedic Surgery

## 2017-07-12 ENCOUNTER — Encounter (INDEPENDENT_AMBULATORY_CARE_PROVIDER_SITE_OTHER): Payer: Self-pay | Admitting: Orthopaedic Surgery

## 2017-07-12 VITALS — BP 118/82 | HR 91 | Ht 64.0 in | Wt 190.0 lb

## 2017-07-12 DIAGNOSIS — M2550 Pain in unspecified joint: Secondary | ICD-10-CM | POA: Diagnosis not present

## 2017-07-12 DIAGNOSIS — M7061 Trochanteric bursitis, right hip: Secondary | ICD-10-CM | POA: Diagnosis not present

## 2017-07-12 DIAGNOSIS — M79672 Pain in left foot: Secondary | ICD-10-CM

## 2017-07-12 DIAGNOSIS — G5691 Unspecified mononeuropathy of right upper limb: Secondary | ICD-10-CM | POA: Diagnosis not present

## 2017-07-12 DIAGNOSIS — M7062 Trochanteric bursitis, left hip: Secondary | ICD-10-CM

## 2017-07-12 DIAGNOSIS — G5692 Unspecified mononeuropathy of left upper limb: Secondary | ICD-10-CM | POA: Diagnosis not present

## 2017-07-12 DIAGNOSIS — M79671 Pain in right foot: Secondary | ICD-10-CM | POA: Diagnosis not present

## 2017-07-12 LAB — CBC WITH DIFFERENTIAL/PLATELET
BASOS ABS: 0 {cells}/uL (ref 0–200)
Basophils Relative: 0 %
EOS PCT: 8 %
Eosinophils Absolute: 624 cells/uL — ABNORMAL HIGH (ref 15–500)
HCT: 42.7 % (ref 35.0–45.0)
Hemoglobin: 14.2 g/dL (ref 11.7–15.5)
LYMPHS ABS: 2340 {cells}/uL (ref 850–3900)
Lymphocytes Relative: 30 %
MCH: 29.6 pg (ref 27.0–33.0)
MCHC: 33.3 g/dL (ref 32.0–36.0)
MCV: 89.1 fL (ref 80.0–100.0)
MONO ABS: 390 {cells}/uL (ref 200–950)
MPV: 10.6 fL (ref 7.5–12.5)
Monocytes Relative: 5 %
NEUTROS ABS: 4446 {cells}/uL (ref 1500–7800)
NEUTROS PCT: 57 %
PLATELETS: 243 10*3/uL (ref 140–400)
RBC: 4.79 MIL/uL (ref 3.80–5.10)
RDW: 13.7 % (ref 11.0–15.0)
WBC: 7.8 10*3/uL (ref 3.8–10.8)

## 2017-07-12 NOTE — Progress Notes (Signed)
Office Visit Note   Patient: Kiara Dillon           Date of Birth: [birth year]           MRN: [id number] Visit Date: 07/12/2017              Requested by: [address], [place], [place] [address] PCP: Redmond School, MD   Assessment & Plan: Visit Diagnoses:  1. Polyarthralgia   2. Neuropathy of right hand   3. Neuropathy of hand, left   4. Bilateral foot pain   5. Trochanteric bursitis, left hip   6. Trochanteric bursitis, right hip     Plan: With patient's multiple complaints today I recommend starting with blood work to check CBC with differential,cmet, arthritis panel. HLA-B27, CRP. We'll also get NCV/EMG studies bilateral upper extremities to rule out cervical radiculopathy versus peripheral entrapment neuropathy. Patient states that primary care physician diagnosed her with possible carpal tunnel syndrome about a year ago and has been managing this conservatively with wrist splints. Patient has an appointment with [facility] august 5 so they should have access to the  results of blood work performed today. We'll order further diagnostic studies depending upon the results of things done today.  All questions answered.  Follow-Up Instructions: Return in about 3 weeks (around 08/02/2017) for review labs and ncv/emg with Dr Lorin Mercy.   Orders:  Orders Placed This Encounter  Procedures  . Ambulatory referral to Physical Medicine Rehab   No orders of the defined types were placed in this encounter.     Procedures: No procedures performed   Clinical Data: No additional findings.   Subjective: Chief Complaint  Patient presents with  . Left Hip - Pain  . Right Hip - Pain  . Right Foot - Pain  . Left Foot - Pain    HPI Patient returns to the office today with multiple complaints. She was last in the office by Dr. Lorin Mercy 02/09/2016 and at that point had left hip greater trochanter bursa injection. We have also seen patient for right hip  bursitis and bilateral plantar fasciitis which is also been conservatively managed with injections. Patient states that it has been a long time since we've last seen her due to insurance issues. She still continues to have ongoing pain in both hips and bilateral feet. She is also complaining of multiple areas of joint pain which include bilateral shoulders, bilateral elbows, bilateral wrist knees and ankles. Numbness and tingling in both hands. She also states at times her arms and hands feel weak. States that primary care physician diagnosed her with bilateral carpal tunnel syndrome about a year ago and this has been conservatively managed with wrist splint. Patient states that she's had increased pain in all areas over the last month. No injury. She thinks that she has a family member with rheumatoid arthritis although she has never been tested for this. She is also been having some abdominal issues and is scheduled to see [facility] within the next couple weeks.  Review of Systems  Constitutional: Positive for activity change.  Respiratory: Negative.   Gastrointestinal: Positive for abdominal distention and abdominal pain.  Musculoskeletal: Positive for arthralgias, back pain, joint swelling and myalgias.  Neurological: Positive for weakness and numbness.     Objective: Vital Signs: BP 118/82   Pulse 91   Ht '5\' 4"'$  (1.626 m)   Wt 190 lb (86.2 kg)   BMI 32.61 kg/m   Physical Exam  Constitutional:  She is oriented to person, place, and time. No distress.  HENT:  Head: Normocephalic and atraumatic.  Eyes: Pupils are equal, round, and reactive to light. EOM are normal.  Neck:  Mild bilateral brachial plexus trapezius and scapular border tenderness.  Pulmonary/Chest: No respiratory distress.  Abdominal: She exhibits distension.  Musculoskeletal:  Bilateral shoulders she does have good range of motion but with some discomfort. Negative impingement test. She does have some tenderness to  palpation around bilateral shoulders. Bilateral elbows good range of motion. Bilateral elbows are tender. No gross swelling. Bilateral wrist good range of motion but with some discomfort. Bilateral wrist joints are tender on the dorsal aspect. Positive Tinel's and Phalen's bilateral wrists. Question trace bilateral grip weakness. Negative logroll bilateral hips. She is moderate- markedly tender over the bilateral hip greater trochanter bursa.  Negative straight leg raise. Bilateral knees good range motion. Bilateral knees are tender along the joint line. Mild swelling. Bilateral ankles good range of motion. She is tender along the anterior tibiotalar joint lines.  Moderate to markedly tender bilateral plantar fascial calcaneal insertion. Bilateral calves are nontender.  Neurological: She is alert and oriented to person, place, and time.  Skin: Skin is warm and dry.  Psychiatric: She has a normal mood and affect.    Ortho Exam  Specialty Comments:  No specialty comments available.  Imaging: No results found.   PMFS History: Patient Active Problem List   Diagnosis Date Noted  . IBS (irritable bowel syndrome) 10/21/2015  . Diarrhea 05/11/2015  . Abdominal wall pain 12/04/2014  . Bloating 06/08/2014  . Abdominal pain, epigastric 03/18/2014  . Nausea with vomiting 03/18/2014  . Other malaise and fatigue 03/18/2014  . Gastroparesis 03/18/2014  . Loss of weight 03/18/2014  . Breast pain, right 10/01/2013   Past Medical History:  Diagnosis Date  . Asthma   . Cholestasis of pregnancy   . Chronic back pain   . Chronic headache    photophobia  . Complication of anesthesia   . Degenerative disc disease   . GERD (gastroesophageal reflux disease)   . 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 Plantar fasciitis, bilateral   . PONV (postoperative nausea and vomiting)   . Urgency of urination   . Urine retention     Family History  Problem Relation Age of Onset  . Arthritis  Mother   . Asthma Mother   . Depression Mother   . Hypertension Mother   . Miscarriages / Stillbirths Maternal Aunt   . Stroke Paternal Uncle   . Heart disease Paternal Uncle   . Cancer Maternal Grandfather        kidney  . Heart disease Paternal Grandmother     Past Surgical History:  Procedure Laterality Date  . BACTERIAL OVERGROWTH TEST N/A 09/16/2014   Procedure: BACTERIAL OVERGROWTH TEST;  Surgeon: Daneil Dolin, MD;  Location: [facility];  Service: Endoscopy;  Laterality: N/A;  0730 - rescheduled to 9/29 - Ginger to notify pt  . CHOLECYSTECTOMY N/A 04/14/2014   Procedure: LAPAROSCOPIC CHOLECYSTECTOMY;  Surgeon: Jamesetta So, MD;  Location: [facility];  Service: General;  Laterality: N/A;  . COLONOSCOPY  02/2014   Dr. Saralyn Pilar Hung:(propofol) normal  . CYSTO WITH HYDRODISTENSION N/A 12/18/2015   Procedure: CYSTOSCOPY/HYDRODISTENSION - INSTILL PYRIDIUM AND MARCAINE;  Surgeon: Irine Seal, MD;  Location: [facility];  Service: Urology;  Laterality: N/A;  . ESOPHAGOGASTRODUODENOSCOPY  02/2014   Dr. Carol Ada: (propofol) retained fluid/gastric contents  found in gastric fundus and body. Fluid aspiration to clear gastric lumen. Patient developed asthmatic exacerbation during procedure and had to be woke up to receive nebulizer.  Marland Kitchen GALLBLADDER SURGERY    . WISDOM TOOTH EXTRACTION     Social History   Occupational History  .  [employer]   Social History Main Topics  . Smoking status: Current Some Day Smoker    Packs/day: 0.50    Years: 5.00    Types: Cigarettes    Last attempt to quit: 04/11/2011  . Smokeless tobacco: Never Used     Comment: Quit x 2 years  . Alcohol use No  . Drug use: No  . Sexual activity: Yes    Birth control/ protection: None

## 2017-07-12 NOTE — Addendum Note (Signed)
Addended by: Rogers SeedsYEATTS, Skarlette Lattner M on: 07/12/2017 11:11 AM   Modules accepted: Orders

## 2017-07-13 LAB — COMPREHENSIVE METABOLIC PANEL
ALBUMIN: 4.3 g/dL (ref 3.6–5.1)
ALT: 17 U/L (ref 6–29)
AST: 13 U/L (ref 10–30)
Alkaline Phosphatase: 96 U/L (ref 33–115)
BUN: 12 mg/dL (ref 7–25)
CHLORIDE: 104 mmol/L (ref 98–110)
CO2: 22 mmol/L (ref 20–31)
Calcium: 9 mg/dL (ref 8.6–10.2)
Creat: 1.28 mg/dL — ABNORMAL HIGH (ref 0.50–1.10)
Glucose, Bld: 60 mg/dL — ABNORMAL LOW (ref 65–99)
POTASSIUM: 3.7 mmol/L (ref 3.5–5.3)
Sodium: 141 mmol/L (ref 135–146)
TOTAL PROTEIN: 6.7 g/dL (ref 6.1–8.1)
Total Bilirubin: 0.2 mg/dL (ref 0.2–1.2)

## 2017-07-13 LAB — RHEUMATOID FACTOR

## 2017-07-13 LAB — SEDIMENTATION RATE: Sed Rate: 4 mm/hr (ref 0–20)

## 2017-07-13 LAB — C-REACTIVE PROTEIN: CRP: 3.4 mg/L (ref <8.0)

## 2017-07-13 LAB — URIC ACID: URIC ACID, SERUM: 4.2 mg/dL (ref 2.5–7.0)

## 2017-07-14 ENCOUNTER — Telehealth (INDEPENDENT_AMBULATORY_CARE_PROVIDER_SITE_OTHER): Payer: Self-pay

## 2017-07-14 LAB — ANA: ANA: NEGATIVE

## 2017-07-14 NOTE — Telephone Encounter (Signed)
S/w pt to set up nerve study and she is wanting someone to call her back with lab results from the other day if we have them back yet

## 2017-07-14 NOTE — Telephone Encounter (Signed)
I called [facility]. They are going to fax lab results to [phone] as they are still only saying collected in the computer.

## 2017-07-17 LAB — HLA-B27 ANTIGEN: DNA RESULT: POSITIVE — AB

## 2017-07-18 NOTE — Telephone Encounter (Signed)
Labs were faxed. I put copy for you to review.  Please advise.

## 2017-07-25 NOTE — Telephone Encounter (Signed)
Can you please call patient and tell her we need to see her back in the office and schedule an appt for her?  Thanks

## 2017-07-25 NOTE — Telephone Encounter (Addendum)
Patient has appointment scheduled for 08/02/2017.  Tried calling patient to advise her of appointment, but mailbox was full.

## 2017-07-25 NOTE — Telephone Encounter (Signed)
?   Return office visit.  

## 2017-07-28 ENCOUNTER — Other Ambulatory Visit (HOSPITAL_COMMUNITY)
Admission: AD | Admit: 2017-07-28 | Discharge: 2017-07-28 | Disposition: A | Discharge status: Home / Self Care | Payer: Medicaid Other | Source: Other Acute Inpatient Hospital | Attending: Urology | Admitting: Urology

## 2017-07-28 ENCOUNTER — Ambulatory Visit (INDEPENDENT_AMBULATORY_CARE_PROVIDER_SITE_OTHER): Payer: Medicaid Other | Admitting: Urology

## 2017-07-28 ENCOUNTER — Encounter (INDEPENDENT_AMBULATORY_CARE_PROVIDER_SITE_OTHER): Payer: Self-pay

## 2017-07-28 ENCOUNTER — Encounter (INDEPENDENT_AMBULATORY_CARE_PROVIDER_SITE_OTHER): Payer: Medicaid Other | Admitting: Physical Medicine and Rehabilitation

## 2017-07-28 DIAGNOSIS — N3010 Interstitial cystitis (chronic) without hematuria: Secondary | ICD-10-CM | POA: Diagnosis not present

## 2017-07-28 DIAGNOSIS — R350 Frequency of micturition: Secondary | ICD-10-CM

## 2017-07-28 DIAGNOSIS — N393 Stress incontinence (female) (male): Secondary | ICD-10-CM | POA: Diagnosis not present

## 2017-07-28 LAB — URINALYSIS, COMPLETE (UACMP) WITH MICROSCOPIC
Bilirubin Urine: NEGATIVE
GLUCOSE, UA: NEGATIVE mg/dL
HGB URINE DIPSTICK: NEGATIVE
Ketones, ur: NEGATIVE mg/dL
NITRITE: NEGATIVE
PH: 5 (ref 5.0–8.0)
Protein, ur: NEGATIVE mg/dL
SPECIFIC GRAVITY, URINE: 1.016 (ref 1.005–1.030)

## 2017-07-30 LAB — URINE CULTURE

## 2017-08-02 ENCOUNTER — Ambulatory Visit (INDEPENDENT_AMBULATORY_CARE_PROVIDER_SITE_OTHER): Payer: Medicaid Other | Admitting: Orthopaedic Surgery

## 2017-08-08 ENCOUNTER — Encounter (INDEPENDENT_AMBULATORY_CARE_PROVIDER_SITE_OTHER): Payer: Medicaid Other | Admitting: Physical Medicine and Rehabilitation

## 2017-08-16 ENCOUNTER — Encounter: Payer: Self-pay | Admitting: *Deleted

## 2017-08-17 ENCOUNTER — Other Ambulatory Visit: Payer: Medicaid Other | Admitting: Obstetrics and Gynecology

## 2017-08-25 ENCOUNTER — Ambulatory Visit (INDEPENDENT_AMBULATORY_CARE_PROVIDER_SITE_OTHER): Payer: Medicaid Other | Admitting: Physical Medicine and Rehabilitation

## 2017-08-25 DIAGNOSIS — R202 Paresthesia of skin: Secondary | ICD-10-CM

## 2017-08-25 NOTE — Progress Notes (Deleted)
Bilateral hand and arm pain and numbness. Worse on right side. Right hand dominant. States both hands feel weak. Pain radiates to elbow at times. Cramps in right hand with writing.

## 2017-08-29 NOTE — Progress Notes (Signed)
Kiara Dillon - 36 y.o. female MRN [id number]  Date of birth: 1981/01/28  Office Visit Note: Visit Date: 08/25/2017 PCP: Elfredia Nevins, MD Referred by: Elfredia Nevins, MD  Subjective: Chief Complaint  Patient presents with  . Right Hand - Pain, Weakness, Numbness  . Left Hand - Pain, Weakness, Numbness   HPI: Kiara Dillon is a very pleasant 36 year old right-hand dominant female who recently saw Dr. Ophelia Charter for evaluation of multiple somatic body complaints. The patient does have widespread body pain in general and history of altered poll musculoskeletal complaints. She reports having a history of fibromyalgia. She reports that her mother has fibromyalgia as well. 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 Her biggest complaint is bilateral hand and arm pain with numbness and nondermatomal fashion with a lot of cramping particularly in the right hand with writing. She reports the pain will radiate from the hands up to the elbows at times. She does have a history of neck pain. She does not have any frank radicular symptoms down the arms. She states that both hands feel generally weak. She reports at this point that she is not exercising very much. She also reports that her sleep is not good. She has pending lab work looking for rheumatologic type laboratories. She has not had a prior electrodiagnostic study.    ROS Otherwise per HPI.  Assessment & Plan: Visit Diagnoses:  1. Paresthesia of skin     Plan: No additional findings.  mpression: Essentially NORMAL electrodiagnostic study of both upper limbs.  There is no significant electrodiagnostic evidence of nerve entrapment, brachial plexopathy, cervical radiculopathy or generalized peripheral neuropathy.    As you know, purely sensory or demyelinating radiculopathies and chemical radiculitis may not be  detected with this particular electrodiagnostic study.  This electrodiagnostic study cannot rule out small fiber polyneuropathy and dysesthesias from central pain sensitization syndromes such as fibromyalgia.  Recommendations: 1.  Follow-up with referring physician. 2.  Continue current management of symptoms.    Meds & Orders: No orders of the defined types were placed in this encounter.   Orders Placed This Encounter  Procedures  . NCV with EMG (electromyography)    Follow-up: Return for Dr. Ophelia Charter.   Procedures: No procedures performed  EMG & NCV Findings: Evaluation of the right ulnar sensory nerve showed reduced amplitude (10.2 V).  All remaining nerves (as indicated in the following tables) were within normal limits.  All left vs. right side differences were within normal limits.    All examined muscles (as indicated in the following table) showed no evidence of electrical instability.    Impression: Essentially NORMAL electrodiagnostic study of both upper limbs.  There is no significant electrodiagnostic evidence of nerve entrapment, brachial plexopathy, cervical radiculopathy or generalized peripheral neuropathy.    As you know, purely sensory or demyelinating radiculopathies and chemical radiculitis may not be detected with this particular electrodiagnostic study.  This electrodiagnostic study cannot rule out small fiber polyneuropathy and dysesthesias from central pain sensitization syndromes such as fibromyalgia.  Recommendations: 1.  Follow-up with referring physician. 2.  Continue current management of symptoms.  Nerve Conduction Studies Anti Sensory Summary Table   Stim Site NR Peak (ms) Norm Peak (ms) P-T Amp (V) Norm P-T Amp Site1 Site2 Delta-P (ms) Dist (cm) Vel (m/s) Norm Vel (m/s)  Left Median Acr Palm Anti  Sensory (2nd Digit)  32.8C  Wrist    2.8 <3.6 38.1 >10 Wrist Palm 1.4 0.0    Palm    1.4 <2.0 48.4         Right Median Acr Palm Anti Sensory (2nd  Digit)  31.9C  Wrist    2.8 <3.6 32.7 >10 Wrist Palm 1.3 0.0    Palm    1.5 <2.0 51.1         Left Radial Anti Sensory (Base 1st Digit)  32.4C  Wrist    1.9 <3.1 44.5  Wrist Base 1st Digit 1.9 0.0    Right Radial Anti Sensory (Base 1st Digit)  31.8C  Wrist    1.9 <3.1 47.5  Wrist Base 1st Digit 1.9 0.0    Left Ulnar Anti Sensory (5th Digit)  32.8C  Wrist    2.9 <3.7 27.3 >15.0 Wrist 5th Digit 2.9 14.0 48 >38  Right Ulnar Anti Sensory (5th Digit)  31.9C  Wrist    2.6 <3.7 *10.2 >15.0 Wrist 5th Digit 2.6 14.0 54 >38   Motor Summary Table   Stim Site NR Onset (ms) Norm Onset (ms) O-P Amp (mV) Norm O-P Amp Site1 Site2 Delta-0 (ms) Dist (cm) Vel (m/s) Norm Vel (m/s)  Left Median Motor (Abd Poll Brev)  32.4C  Wrist    2.7 <4.2 5.6 >5 Elbow Wrist 3.7 20.3 55 >50  Elbow    6.4  3.8         Right Median Motor (Abd Poll Brev)  31.7C  Wrist    2.6 <4.2 7.0 >5 Elbow Wrist 3.7 20.0 54 >50  Elbow    6.3  7.2         Left Ulnar Motor (Abd Dig Min)  32C  Wrist    2.9 <4.2 5.3 >3 B Elbow Wrist 2.6 17.0 65 >53  B Elbow    5.5  5.8  A Elbow B Elbow 1.0 10.0 100 >53  A Elbow    6.5  6.1         Right Ulnar Motor (Abd Dig Min)  31.6C  Wrist    2.5 <4.2 4.3 >3 B Elbow Wrist 2.8 19.0 68 >53  B Elbow    5.3  6.6  A Elbow B Elbow 1.0 10.0 100 >53  A Elbow    6.3  6.6          EMG   Side Muscle Nerve Root Ins Act Fibs Psw Amp Dur Poly Recrt Int Dennie Bible Comment  Right Abd Poll Brev Median C8-T1 Nml Nml Nml Nml Nml 0 Nml Nml   Right 1stDorInt Ulnar C8-T1 Nml Nml Nml Nml Nml 0 Nml Nml   Right PronatorTeres Median C6-7 Nml Nml Nml Nml Nml 0 Nml Nml   Right Biceps Musculocut C5-6 Nml Nml Nml Nml Nml 0 Nml Nml   Right Deltoid Axillary C5-6 Nml Nml Nml Nml Nml 0 Nml Nml     Nerve Conduction Studies Anti Sensory Left/Right Comparison   Stim Site L Lat (ms) R Lat (ms) L-R Lat (ms) L Amp (V) R Amp (V) L-R Amp (%) Site1 Site2 L Vel (m/s) R Vel (m/s) L-R Vel (m/s)  Median Acr Palm Anti Sensory (2nd  Digit)  32.8C  Wrist 2.8 2.8 0.0 38.1 32.7 14.2 Wrist Palm     Palm 1.4 1.5 0.1 48.4 51.1 5.3       Radial Anti Sensory (Base 1st Digit)  32.4C  Wrist 1.9 1.9 0.0 44.5 47.5 6.3 Wrist Base  1st Digit     Ulnar Anti Sensory (5th Digit)  32.8C  Wrist 2.9 2.6 0.3 27.3 *10.2 62.6 Wrist 5th Digit 48 54 6   Motor Left/Right Comparison   Stim Site L Lat (ms) R Lat (ms) L-R Lat (ms) L Amp (mV) R Amp (mV) L-R Amp (%) Site1 Site2 L Vel (m/s) R Vel (m/s) L-R Vel (m/s)  Median Motor (Abd Poll Brev)  32.4C  Wrist 2.7 2.6 0.1 5.6 7.0 20.0 Elbow Wrist 55 54 1  Elbow 6.4 6.3 0.1 3.8 7.2 47.2       Ulnar Motor (Abd Dig Min)  32C  Wrist 2.9 2.5 0.4 5.3 4.3 18.9 B Elbow Wrist 65 68 3  B Elbow 5.5 5.3 0.2 5.8 6.6 12.1 A Elbow B Elbow 100 100 0  A Elbow 6.5 6.3 0.2 6.1 6.6 7.6          Waveforms:                     Clinical History: No specialty comments available.  She reports that she has been smoking Cigarettes.  She has a 2.50 pack-year smoking history. She has never used smokeless tobacco.   Recent Labs  07/12/17 1030  LABURIC 4.2    Objective:  VS:  HT:    WT:   BMI:     BP:   HR: bpm  TEMP: ( )  RESP:  Physical Exam  Musculoskeletal:  Inspection reveals no atrophy of the bilateral APB or FDI or hand intrinsics. There is no swelling, color changes, allodynia or dystrophic changes. There is 5 out of 5 strength in the bilateral wrist extension, finger abduction and long finger flexion. There is intact sensation to light touch in all dermatomal and peripheral nerve distributions. There is a negative Tinel's test at the bilateral wrist and elbow. The patient does have multiple tender points in the upper shoulders and elbows bilaterally. There is a negative Hoffmann's test bilaterally.    Ortho Exam Imaging: No results found.  Past Medical/Family/Surgical/Social History: Medications & Allergies reviewed per EMR Patient Active Problem List   Diagnosis Date Noted  . IBS  (irritable bowel syndrome) 10/21/2015  . Diarrhea 05/11/2015  . Abdominal wall pain 12/04/2014  . Bloating 06/08/2014  . Abdominal pain, epigastric 03/18/2014  . Nausea with vomiting 03/18/2014  . Other malaise and fatigue 03/18/2014  . Gastroparesis 03/18/2014  . Loss of weight 03/18/2014  . Breast pain, right 10/01/2013   Past Medical History:  Diagnosis Date  . Asthma   . Cholestasis of pregnancy   . Chronic back pain   . Chronic headache    photophobia  . Complication of anesthesia   . Degenerative disc disease   . GERD (gastroesophageal reflux disease)   . IBS (irritable bowel syndrome)   . 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 Plantar fasciitis, bilateral   . PONV (postoperative nausea and vomiting)   . Urgency of urination   . Urine retention    Family History  Problem Relation Age of Onset  . Arthritis Mother   . Asthma Mother   . Depression Mother   . Hypertension Mother   . Stroke Paternal Uncle   . Heart disease Paternal Uncle   . Cancer Maternal Grandfather        kidney  . Heart disease Paternal Grandmother   . Miscarriages / Stillbirths Maternal Aunt    Past Surgical History:  Procedure Laterality Date  .  BACTERIAL OVERGROWTH TEST N/A 09/16/2014   Procedure: BACTERIAL OVERGROWTH TEST;  Surgeon: Corbin Ade, MD;  [facility];  Service: Endoscopy;  Laterality: N/A;  0730 - rescheduled to 9/29 - Ginger to notify pt  . CHOLECYSTECTOMY N/A 04/14/2014   Procedure: LAPAROSCOPIC CHOLECYSTECTOMY;  Surgeon: Dalia Heading, MD;  Location: [facility];  Service: General;  Laterality: N/A;  . COLONOSCOPY  02/2014   Dr. Luisa Hart Hung:(propofol) normal  . CYSTO WITH HYDRODISTENSION N/A 12/18/2015   Procedure: CYSTOSCOPY/HYDRODISTENSION - INSTILL PYRIDIUM AND MARCAINE;  Surgeon: Bjorn Pippin, MD;  Location: [facility];  Service: Urology;  Laterality: N/A;  . ESOPHAGOGASTRODUODENOSCOPY  02/2014   Dr. Jeani Hawking: (propofol) retained fluid/gastric  contents found in gastric fundus and body. Fluid aspiration to clear gastric lumen. Patient developed asthmatic exacerbation during procedure and had to be woke up to receive nebulizer.  Marland Kitchen GALLBLADDER SURGERY    . WISDOM TOOTH EXTRACTION     Social History   Occupational History  .  [employer]   Social History Main Topics  . Smoking status: Current Some Day Smoker    Packs/day: 0.50    Years: 5.00    Types: Cigarettes    Last attempt to quit: 04/11/2011  . Smokeless tobacco: Never Used     Comment: Quit x 2 years  . Alcohol use No  . Drug use: No  . Sexual activity: Yes    Birth control/ protection: None

## 2017-08-29 NOTE — Procedures (Signed)
EMG & NCV Findings: Evaluation of the right ulnar sensory nerve showed reduced amplitude (10.2 V).  All remaining nerves (as indicated in the following tables) were within normal limits.  All left vs. right side differences were within normal limits.    All examined muscles (as indicated in the following table) showed no evidence of electrical instability.    Impression: Essentially NORMAL electrodiagnostic study of both upper limbs.  There is no significant electrodiagnostic evidence of nerve entrapment, brachial plexopathy, cervical radiculopathy or generalized peripheral neuropathy.    As you know, purely sensory or demyelinating radiculopathies and chemical radiculitis may not be detected with this particular electrodiagnostic study.  This electrodiagnostic study cannot rule out small fiber polyneuropathy and dysesthesias from central pain sensitization syndromes such as fibromyalgia.  Recommendations: 1.  Follow-up with referring physician. 2.  Continue current management of symptoms.  Nerve Conduction Studies Anti Sensory Summary Table   Stim Site NR Peak (ms) Norm Peak (ms) P-T Amp (V) Norm P-T Amp Site1 Site2 Delta-P (ms) Dist (cm) Vel (m/s) Norm Vel (m/s)  Left Median Acr Palm Anti Sensory (2nd Digit)  32.8C  Wrist    2.8 <3.6 38.1 >10 Wrist Palm 1.4 0.0    Palm    1.4 <2.0 48.4         Right Median Acr Palm Anti Sensory (2nd Digit)  31.9C  Wrist    2.8 <3.6 32.7 >10 Wrist Palm 1.3 0.0    Palm    1.5 <2.0 51.1         Left Radial Anti Sensory (Base 1st Digit)  32.4C  Wrist    1.9 <3.1 44.5  Wrist Base 1st Digit 1.9 0.0    Right Radial Anti Sensory (Base 1st Digit)  31.8C  Wrist    1.9 <3.1 47.5  Wrist Base 1st Digit 1.9 0.0    Left Ulnar Anti Sensory (5th Digit)  32.8C  Wrist    2.9 <3.7 27.3 >15.0 Wrist 5th Digit 2.9 14.0 48 >38  Right Ulnar Anti Sensory (5th Digit)  31.9C  Wrist    2.6 <3.7 *10.2 >15.0 Wrist 5th Digit 2.6 14.0 54 >38   Motor Summary Table   Stim Site NR Onset (ms) Norm Onset (ms) O-P Amp (mV) Norm O-P Amp Site1 Site2 Delta-0 (ms) Dist (cm) Vel (m/s) Norm Vel (m/s)  Left Median Motor (Abd Poll Brev)  32.4C  Wrist    2.7 <4.2 5.6 >5 Elbow Wrist 3.7 20.3 55 >50  Elbow    6.4  3.8         Right Median Motor (Abd Poll Brev)  31.7C  Wrist    2.6 <4.2 7.0 >5 Elbow Wrist 3.7 20.0 54 >50  Elbow    6.3  7.2         Left Ulnar Motor (Abd Dig Min)  32C  Wrist    2.9 <4.2 5.3 >3 B Elbow Wrist 2.6 17.0 65 >53  B Elbow    5.5  5.8  A Elbow B Elbow 1.0 10.0 100 >53  A Elbow    6.5  6.1         Right Ulnar Motor (Abd Dig Min)  31.6C  Wrist    2.5 <4.2 4.3 >3 B Elbow Wrist 2.8 19.0 68 >53  B Elbow    5.3  6.6  A Elbow B Elbow 1.0 10.0 100 >53  A Elbow    6.3  6.6          EMG  Side Muscle Nerve Root Ins Act Fibs Psw Amp Dur Poly Recrt Int Dennie BiblePat Comment  Right Abd Poll Brev Median C8-T1 Nml Nml Nml Nml Nml 0 Nml Nml   Right 1stDorInt Ulnar C8-T1 Nml Nml Nml Nml Nml 0 Nml Nml   Right PronatorTeres Median C6-7 Nml Nml Nml Nml Nml 0 Nml Nml   Right Biceps Musculocut C5-6 Nml Nml Nml Nml Nml 0 Nml Nml   Right Deltoid Axillary C5-6 Nml Nml Nml Nml Nml 0 Nml Nml     Nerve Conduction Studies Anti Sensory Left/Right Comparison   Stim Site L Lat (ms) R Lat (ms) L-R Lat (ms) L Amp (V) R Amp (V) L-R Amp (%) Site1 Site2 L Vel (m/s) R Vel (m/s) L-R Vel (m/s)  Median Acr Palm Anti Sensory (2nd Digit)  32.8C  Wrist 2.8 2.8 0.0 38.1 32.7 14.2 Wrist Palm     Palm 1.4 1.5 0.1 48.4 51.1 5.3       Radial Anti Sensory (Base 1st Digit)  32.4C  Wrist 1.9 1.9 0.0 44.5 47.5 6.3 Wrist Base 1st Digit     Ulnar Anti Sensory (5th Digit)  32.8C  Wrist 2.9 2.6 0.3 27.3 *10.2 62.6 Wrist 5th Digit 48 54 6   Motor Left/Right Comparison   Stim Site L Lat (ms) R Lat (ms) L-R Lat (ms) L Amp (mV) R Amp (mV) L-R Amp (%) Site1 Site2 L Vel (m/s) R Vel (m/s) L-R Vel (m/s)  Median Motor (Abd Poll Brev)  32.4C  Wrist 2.7 2.6 0.1 5.6 7.0 20.0 Elbow Wrist 55 54 1    Elbow 6.4 6.3 0.1 3.8 7.2 47.2       Ulnar Motor (Abd Dig Min)  32C  Wrist 2.9 2.5 0.4 5.3 4.3 18.9 B Elbow Wrist 65 68 3  B Elbow 5.5 5.3 0.2 5.8 6.6 12.1 A Elbow B Elbow 100 100 0  A Elbow 6.5 6.3 0.2 6.1 6.6 7.6          Waveforms:

## 2017-09-13 ENCOUNTER — Ambulatory Visit (INDEPENDENT_AMBULATORY_CARE_PROVIDER_SITE_OTHER): Payer: Medicaid Other | Admitting: Orthopaedic Surgery

## 2017-09-13 VITALS — BP 122/78 | HR 111

## 2017-09-13 DIAGNOSIS — M5136 Other intervertebral disc degeneration, lumbar region: Secondary | ICD-10-CM | POA: Diagnosis not present

## 2017-09-13 NOTE — Progress Notes (Signed)
Office Visit Note   Patient: Kiara Dillon           Date of Birth: [birth year]           MRN: [id number] Visit Date: 09/13/2017              Requested by: Redmond School, [address], [place] [address] PCP: Redmond School, MD   Assessment & Plan: Visit Diagnoses:  1. Other intervertebral disc degeneration, lumbar region     Plan: No evidence of peripheral nerve compression radiculopathy on electrical test. Recommend she work on a walking program and exercise program and gradual weight loss. She can return if she has increasing symptoms.  Follow-Up Instructions: Return if symptoms worsen or fail to improve.   Orders:  No orders of the defined types were placed in this encounter.  No orders of the defined types were placed in this encounter.     Procedures: No procedures performed   Clinical Data: No additional findings.   Subjective: No chief complaint on file.   HPI 36 year old female returns post EMGs nerve conduction velocities for evaluation of possible cervical radiculopathy versus carpal tunnel syndrome. Electrical tests were normal without evidence of demyelination or compressive lesions. No evidence of brachial plexopathy or peripheral neuropathy. She is used duloxetine which has helped slightly. Lab work was obtained with normal CBC, normal rheumatoid factor normal sedimentation rate, CRP, and normal uric acid. HLA-B27 was positive. Patient continues to have problems with back pain and leg pain and pain in her feet in addition to the hand numbness problem. Normal cervical MRI 11 years ago. Lumbar MRI 2015 showed some evidence of disc herniations slightly worse on the right at L5-S1 on the left at S1 with mild central stenosis at both levels, multifactorial.  Review of Systems 14 point review of systems updated unchanged from 07/12/2017 office visit other than as mentioned above.   Objective: Vital Signs: BP 122/78   Pulse (!) 111    Physical Exam  Ortho Exam  Specialty Comments:  No specialty comments available.  Imaging: No results found.   PMFS History: Patient Active Problem List   Diagnosis Date Noted  . IBS (irritable bowel syndrome) 10/21/2015  . Diarrhea 05/11/2015  . Abdominal wall pain 12/04/2014  . Bloating 06/08/2014  . Abdominal pain, epigastric 03/18/2014  . Nausea with vomiting 03/18/2014  . Other malaise and fatigue 03/18/2014  . Gastroparesis 03/18/2014  . Loss of weight 03/18/2014  . Breast pain, right 10/01/2013   Past Medical History:  Diagnosis Date  . Asthma   . Cholestasis of pregnancy   . Chronic back pain   . Chronic headache    photophobia  . Complication of anesthesia   . Degenerative disc disease   . GERD (gastroesophageal reflux disease)   . IBS (irritable bowel syndrome)   . 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 Plantar fasciitis, bilateral   . PONV (postoperative nausea and vomiting)   . Urgency of urination   . Urine retention     Family History  Problem Relation Age of Onset  . Arthritis Mother   . Asthma Mother   . Depression Mother   . Hypertension Mother   . Stroke Paternal Uncle   . Heart disease Paternal Uncle   . Cancer Maternal Grandfather        kidney  . Heart disease Paternal Grandmother   . Miscarriages / Stillbirths Maternal Aunt  Past Surgical History:  Procedure Laterality Date  . BACTERIAL OVERGROWTH TEST N/A 09/16/2014   Procedure: BACTERIAL OVERGROWTH TEST;  Surgeon: Daneil Dolin, MD;  Location: [facility];  Service: Endoscopy;  Laterality: N/A;  0730 - rescheduled to 9/29 - Ginger to notify pt  . CHOLECYSTECTOMY N/A 04/14/2014   Procedure: LAPAROSCOPIC CHOLECYSTECTOMY;  Surgeon: Jamesetta So, MD;  Location: [facility];  Service: General;  Laterality: N/A;  . COLONOSCOPY  02/2014   Dr. Saralyn Pilar Hung:(propofol) normal  . CYSTO WITH HYDRODISTENSION N/A 12/18/2015   Procedure: CYSTOSCOPY/HYDRODISTENSION - INSTILL  PYRIDIUM AND MARCAINE;  Surgeon: Irine Seal, MD;  Location: [facility];  Service: Urology;  Laterality: N/A;  . ESOPHAGOGASTRODUODENOSCOPY  02/2014   Dr. Carol Ada: (propofol) retained fluid/gastric contents found in gastric fundus and body. Fluid aspiration to clear gastric lumen. Patient developed asthmatic exacerbation during procedure and had to be woke up to receive nebulizer.  Marland Kitchen GALLBLADDER SURGERY    . WISDOM TOOTH EXTRACTION     Social History   Occupational History  .  [employer]   Social History Main Topics  . Smoking status: Current Some Day Smoker    Packs/day: 0.50    Years: 5.00    Types: Cigarettes    Last attempt to quit: 04/11/2011  . Smokeless tobacco: Never Used     Comment: Quit x 2 years  . Alcohol use No  . Drug use: No  . Sexual activity: Yes    Birth control/ protection: None

## 2017-09-20 ENCOUNTER — Other Ambulatory Visit (HOSPITAL_COMMUNITY)
Admission: RE | Admit: 2017-09-20 | Discharge: 2017-09-20 | Disposition: A | Discharge status: Home / Self Care | Payer: Medicaid Other | Source: Ambulatory Visit | Attending: Obstetrics and Gynecology | Admitting: Obstetrics and Gynecology

## 2017-09-20 ENCOUNTER — Ambulatory Visit (INDEPENDENT_AMBULATORY_CARE_PROVIDER_SITE_OTHER): Payer: Medicaid Other | Admitting: Obstetrics and Gynecology

## 2017-09-20 ENCOUNTER — Encounter: Payer: Self-pay | Admitting: Obstetrics and Gynecology

## 2017-09-20 VITALS — BP 118/76 | HR 103 | Ht 64.0 in | Wt 204.0 lb

## 2017-09-20 DIAGNOSIS — Z124 Encounter for screening for malignant neoplasm of cervix: Secondary | ICD-10-CM | POA: Insufficient documentation

## 2017-09-20 DIAGNOSIS — Z1151 Encounter for screening for human papillomavirus (HPV): Secondary | ICD-10-CM | POA: Insufficient documentation

## 2017-09-20 DIAGNOSIS — A5901 Trichomonal vulvovaginitis: Secondary | ICD-10-CM | POA: Diagnosis not present

## 2017-09-20 DIAGNOSIS — N760 Acute vaginitis: Secondary | ICD-10-CM | POA: Diagnosis not present

## 2017-09-20 DIAGNOSIS — Z0001 Encounter for general adult medical examination with abnormal findings: Secondary | ICD-10-CM | POA: Diagnosis not present

## 2017-09-20 NOTE — Patient Instructions (Signed)

## 2017-09-20 NOTE — Progress Notes (Addendum)
Patient ID: Kiara Dillon, female   DOB: 19-Oct-1981, 36 y.o.   MRN: [id number]  Assessment:  1. Annual Gyn Exam 2. Overweight 3. Depressed  Trichomonas vaginitis on Pap, also BV  Plan:  1. pap smear done, next pap due 3 years 2. return annually or prn 3.   Advised weight loss, walking or water aerobics  4.   F/U with PCP: Elfredia Nevins, MD about Neurontin  5.   F/U 1 year GYN 6   Metronidazole 500 bid x 7d Subjective:  Kiara Dillon is a 36 y.o. female G53P1011 who presents for annual exam. No LMP recorded. Patient has had an implant. Body mass index is 35.02 kg/m.   The patient reports today for an annual gyn exam. Pt has medicaid and states that she didn't have an Chief Financial Officer. Pt has very tender breasts. She has gained weight. Her eating habits have changed and she is not as active as she was before. Pt endorses decreased motivation, energy, and abdominal tenderness. Pt is unsure of her last menstrual cycle and states they are not regular. She is currently unemployed. Otherwise pt denies any other associated symptoms or complaints at this time.   The following portions of the patient's history were reviewed and updated as appropriate: allergies, current medications, past family history, past medical history, past social history, past surgical history and problem list. Past Medical History:  Diagnosis Date  . Asthma   . Cholestasis of pregnancy   . Chronic back pain   . Chronic headache    photophobia  . Complication of anesthesia   . Degenerative disc disease   . Fibromyalgia   . GERD (gastroesophageal reflux disease)   . IBS (irritable bowel syndrome)   . 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 Plantar fasciitis, bilateral   . PONV (postoperative nausea and vomiting)   . Urgency of urination   . Urine retention     Past Surgical History:  Procedure Laterality Date  . BACTERIAL OVERGROWTH TEST N/A 09/16/2014   Procedure: BACTERIAL OVERGROWTH TEST;  Surgeon:  Corbin Ade, MD;  [facility]: [facility];  Service: Endoscopy;  Laterality: N/A;  0730 - rescheduled to 9/29 - Ginger to notify pt  . CHOLECYSTECTOMY N/A 04/14/2014   Procedure: LAPAROSCOPIC CHOLECYSTECTOMY;  Surgeon: Dalia Heading, MD;  Location: [facility];  Service: General;  Laterality: N/A;  . COLONOSCOPY  02/2014   Dr. Luisa Hart Hung:(propofol) normal  . CYSTO WITH HYDRODISTENSION N/A 12/18/2015   Procedure: CYSTOSCOPY/HYDRODISTENSION - INSTILL PYRIDIUM AND MARCAINE;  Surgeon: Bjorn Pippin, MD;  Location: [facility];  Service: Urology;  Laterality: N/A;  . ESOPHAGOGASTRODUODENOSCOPY  02/2014   Dr. Jeani Hawking: (propofol) retained fluid/gastric contents found in gastric fundus and body. Fluid aspiration to clear gastric lumen. Patient developed asthmatic exacerbation during procedure and had to be woke up to receive nebulizer.  Marland Kitchen GALLBLADDER SURGERY    . WISDOM TOOTH EXTRACTION       Current Outpatient Prescriptions:  .  amitriptyline (ELAVIL) 25 MG tablet, Take 25 mg by mouth at bedtime., Disp: , Rfl:  .  DULoxetine (CYMBALTA) 30 MG capsule, Take 30 mg by mouth daily., Disp: , Rfl:  .  hydrOXYzine (ATARAX/VISTARIL) 25 MG tablet, Take 25 mg by mouth at bedtime., Disp: , Rfl:   Review of Systems Constitutional: depressed, unmotivated, low energy Gastrointestinal: abdominal tenderness Genitourinary: negative  Objective:  BP 118/76 (BP Location: Right Arm, Patient Position: Sitting, Cuff Size: Normal)  Pulse (!) 103   Ht  (1.626 m)   Wt 204 lb (92.5 kg)   BMI 35.02 kg/m    BMI: Body mass index is 35.02 kg/m.  General Appearance: Alert, appropriate appearance for age. No acute distress, depressed mood HEENT: Grossly normal Neck / Thyroid:  Cardiovascular: RRR; normal S1, S2, no murmur Lungs: CTA bilaterally Back: No CVAT Breast Exam: No dimpling, nipple retraction or discharge. No masses or nodes., Normal to inspection and No masses or nodes. Firm, full due to weight  gain Gastrointestinal: Soft, non-tender, no masses or organomegaly Pelvic Exam: Vulva and vagina appear normal. Bimanual exam reveals normal uterus and adnexa. External genitalia: normal general appearance Vaginal: slight vaginal sidewall tenderness, R>L Bladder: non tender  Uterus: minimal, 2/10 tender, normal size Lymphatic Exam: Non-palpable nodes in neck, clavicular, axillary, or inguinal regions  Skin: no rash or abnormalities Neurologic: Normal gait and speech, no tremor  Psychiatric: Alert and oriented, appropriate affect.  Urinalysis:Not done  Discussion: Discussed with pt weight loss methods including food measurement and calorie counting using apps such as MyNetDiary or My Fitness Pal. Recommended the use of measuring cups and spoons to monitor serving sizes. Encouraged adequate daily water intake, especially prior to meals to eliminate overeating. Additionally encouraged pt to become more active by taking daily walks of at least 30 minute duration, join a local gym such as the [facility] or attend water aerobics classes. Also advised pt to use pedometer on smartphone or utilize a smartband such as FitBit to keep track of daily activity.   At end of discussion, pt had opportunity to ask questions and has no further questions at this time.   Specific discussion of lifestyle changes, behavioral modifications and healthy eating habits as noted above. Greater than 50% was spent in counseling and coordination of care with the patient.  Total time greater than: 30 minutes.    Christin Bach. MD Pgr [phone] 4:03 PM   By signing my name below, I, Diona Browner, attest that this documentation has been prepared under the direction and in the presence of Tilda Burrow, MD. Electronically Signed: Diona Browner, Medical Scribe. 09/20/17. 4:03 PM.  I personally performed the services described in this documentation, which was SCRIBED in my presence. The recorded information has been  reviewed and considered accurate. It has been edited as necessary during review. Tilda Burrow, MD

## 2017-09-22 ENCOUNTER — Ambulatory Visit (INDEPENDENT_AMBULATORY_CARE_PROVIDER_SITE_OTHER): Payer: Medicaid Other | Admitting: Urology

## 2017-09-22 DIAGNOSIS — N3010 Interstitial cystitis (chronic) without hematuria: Secondary | ICD-10-CM

## 2017-09-22 DIAGNOSIS — N3946 Mixed incontinence: Secondary | ICD-10-CM

## 2017-09-22 DIAGNOSIS — R350 Frequency of micturition: Secondary | ICD-10-CM | POA: Diagnosis not present

## 2017-09-22 LAB — CYTOLOGY - PAP
Chlamydia: NEGATIVE
Diagnosis: NEGATIVE
HPV (WINDOPATH): NOT DETECTED
NEISSERIA GONORRHEA: NEGATIVE

## 2017-09-22 MED ORDER — METRONIDAZOLE 500 MG PO TABS: 500.0000 mg | ORAL_TABLET | Freq: Two times a day (BID) | ORAL | 0 refills | Status: AC

## 2017-09-22 NOTE — Addendum Note (Signed)
Addended by: Tilda Burrow on: 09/22/2017 09:28 PM   Modules accepted: Orders

## 2017-09-25 ENCOUNTER — Telehealth: Payer: Self-pay | Admitting: *Deleted

## 2017-09-25 NOTE — Telephone Encounter (Signed)
Attempted to reach pt to notify her of lab results and prescription that was sent in. No answer. LMOVM to return my call.

## 2017-09-25 NOTE — Telephone Encounter (Signed)
Pt informed of positive trichomonas and bacterial vaginosis on pap smear. Informed pt that Rx was sent into pharmacy and no sex. Advised to f/u in 3 weeks for POC. Pt verbalized understanding.

## 2017-09-29 ENCOUNTER — Encounter: Payer: Self-pay | Admitting: Obstetrics and Gynecology

## 2017-09-29 DIAGNOSIS — A5901 Trichomonal vulvovaginitis: Secondary | ICD-10-CM | POA: Insufficient documentation

## 2017-10-16 ENCOUNTER — Ambulatory Visit: Payer: Medicaid Other | Admitting: Women's Health

## 2017-10-16 ENCOUNTER — Telehealth: Payer: Self-pay | Admitting: Obstetrics and Gynecology

## 2017-10-16 NOTE — Telephone Encounter (Signed)
Pt called stating that she was suppose to start a medication for POC and she has not started it yet. Pt states that her pharmacy doesn't have it yet. Please contact pt

## 2017-10-16 NOTE — Telephone Encounter (Signed)
Patient called stating she never took her medication for trich. Will call medication order in to pharmacy. Pt instructed to come for POC in 3 weeks. Verbalized understanding.

## 2017-11-03 ENCOUNTER — Ambulatory Visit: Payer: Self-pay | Admitting: Urology

## 2017-11-15 NOTE — Progress Notes (Deleted)
Psychiatric Initial Adult Assessment   Patient Identification: Kiara Dillon MRN:  [id number] Date of Evaluation:  11/15/2017 Referral Source: *** Chief Complaint:   Visit Diagnosis: No diagnosis found.  History of Present Illness:   Kiara Dillon is a 36 year old female with depression, fibromyalgia, disc degenerative disease, hypertension, asthma, who is referred for depression.    Associated Signs/Symptoms: Depression Symptoms:  {DEPRESSION SYMPTOMS:20000} (Hypo) Manic Symptoms:  {BHH MANIC SYMPTOMS:22872} Anxiety Symptoms:  {BHH ANXIETY SYMPTOMS:22873} Psychotic Symptoms:  {BHH PSYCHOTIC SYMPTOMS:22874} PTSD Symptoms: {BHH PTSD SYMPTOMS:22875}  Past Psychiatric History:  Outpatient:  Psychiatry admission:  Previous suicide attempt:  Past trials of medication:  History of violence:   Previous Psychotropic Medications: {YES/NO:21197}  Substance Abuse History in the last 12 months:  {yes no:314532}  Consequences of Substance Abuse: {BHH CONSEQUENCES OF SUBSTANCE ABUSE:22880}  Past Medical History:  Past Medical History:  Diagnosis Date  . Asthma   . Cholestasis of pregnancy   . Chronic back pain   . Chronic headache    photophobia  . Complication of anesthesia   . Degenerative disc disease   . Fibromyalgia   . GERD (gastroesophageal reflux disease)   . IBS (irritable bowel syndrome)   . 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 Plantar fasciitis, bilateral   . PONV (postoperative nausea and vomiting)   . Urgency of urination   . Urine retention     Past Surgical History:  Procedure Laterality Date  . BACTERIAL OVERGROWTH TEST N/A 09/16/2014   Procedure: BACTERIAL OVERGROWTH TEST;  Surgeon: Corbin Ade, MD;  [facility];  Service: Endoscopy;  Laterality: N/A;  0730 - rescheduled to 9/29 - Ginger to notify pt  . CHOLECYSTECTOMY N/A 04/14/2014   Procedure: LAPAROSCOPIC CHOLECYSTECTOMY;  Surgeon: Dalia Heading, MD;  Location: [facility];  Service: General;  Laterality: N/A;  . COLONOSCOPY  02/2014   Dr. Luisa Hart Hung:(propofol) normal  . CYSTO WITH HYDRODISTENSION N/A 12/18/2015   Procedure: CYSTOSCOPY/HYDRODISTENSION - INSTILL PYRIDIUM AND MARCAINE;  Surgeon: Bjorn Pippin, MD;  Location: [facility];  Service: Urology;  Laterality: N/A;  . ESOPHAGOGASTRODUODENOSCOPY  02/2014   Dr. Jeani Hawking: (propofol) retained fluid/gastric contents found in gastric fundus and body. Fluid aspiration to clear gastric lumen. Patient developed asthmatic exacerbation during procedure and had to be woke up to receive nebulizer.  Marland Kitchen GALLBLADDER SURGERY    . WISDOM TOOTH EXTRACTION      Family Psychiatric History: ***  Family History:  Family History  Problem Relation Age of Onset  . Arthritis Mother   . Asthma Mother   . Depression Mother   . Hypertension Mother   . Stroke Paternal Uncle   . Heart disease Paternal Uncle   . Cancer Maternal Grandfather        kidney  . Heart disease Paternal Grandmother   . Miscarriages / Stillbirths Maternal Aunt   . Hypertension Sister     Social History:   Social History   Socioeconomic History  . Marital status: Single    Spouse name: Not on file  . Number of children: 1  . Years of education: Not on file  . Highest education level: Not on file  Social Needs  . Financial resource strain: Not on file  . Food insecurity - worry: Not on file  . Food insecurity - inability: Not on file  . Transportation needs - medical: Not on file  . Transportation needs - non-medical: Not on  file  Occupational History    Employer: [employer]  Tobacco Use  . Smoking status: Current Some Day Smoker    Packs/day: 0.50    Years: 5.00    Pack years: 2.50    Types: Cigarettes    Last attempt to quit: 04/11/2011    Years since quitting: 6.6  . Smokeless tobacco: Never Used  . Tobacco comment: Quit x 2 years  Substance and Sexual Activity  . Alcohol use: Yes    Comment: occ  . Drug use: No  . Sexual  activity: Yes    Birth control/protection: Implant  Other Topics Concern  . Not on file  Social History Narrative   Single, one child    Additional Social History: ***  Allergies:   Allergies  Allergen Reactions  . Penicillins Other (See Comments)    Has patient had a PCN reaction causing immediate rash, facial/tongue/throat swelling, SOB or lightheadedness with hypotension: Unknown Has patient had a PCN reaction causing severe rash involving mucus membranes or skin necrosis: Unknown Has patient had a PCN reaction that required hospitalization Unknown Has patient had a PCN reaction occurring within the last 10 years: Unknown If all of the above answers are "NO", then may proceed with Cephalosporin use.   . Other Rash    Bandaids, not surgical tape    Metabolic Disorder Labs: No results found for: HGBA1C, MPG No results found for: PROLACTIN No results found for: CHOL, TRIG, HDL, CHOLHDL, VLDL, LDLCALC   Current Medications: Current Outpatient Medications  Medication Sig Dispense Refill  . amitriptyline (ELAVIL) 25 MG tablet Take 25 mg by mouth at bedtime.    . DULoxetine (CYMBALTA) 30 MG capsule Take 30 mg by mouth daily.    . hydrOXYzine (ATARAX/VISTARIL) 25 MG tablet Take 25 mg by mouth at bedtime.     No current facility-administered medications for this visit.     Neurologic: Headache: No Seizure: No Paresthesias:No  Musculoskeletal: Strength & Muscle Tone: within normal limits Gait & Station: normal Patient leans: N/A  Psychiatric Specialty Exam: ROS  There were no vitals taken for this visit.There is no height or weight on file to calculate BMI.  General Appearance: Fairly Groomed  Eye Contact:  Good  Speech:  Clear and Coherent  Volume:  Normal  Mood:  {BHH MOOD:22306}  Affect:  {Affect (PAA):22687}  Thought Process:  Coherent and Goal Directed  Orientation:  Full (Time, Place, and Person)  Thought Content:  Logical  Suicidal Thoughts:  {ST/HT  (PAA):22692}  Homicidal Thoughts:  {ST/HT (PAA):22692}  Memory:  Immediate;   Good Recent;   Good Remote;   Good  Judgement:  {Judgement (PAA):22694}  Insight:  {Insight (PAA):22695}  Psychomotor Activity:  Normal  Concentration:  Concentration: Good and Attention Span: Good  Recall:  Good  Fund of Knowledge:Good  Language: Good  Akathisia:  No  Handed:  Right  AIMS (if indicated):  N/A  Assets:  Communication Skills Desire for Improvement  ADL's:  Intact  Cognition: WNL  Sleep:  ***   Assessment  Plan  The patient demonstrates the following risk factors for suicide: Chronic risk factors for suicide include: {Chronic Risk Factors for XLKGMWN:02725366}Suicide:30414011}. Acute risk factors for suicide include: {Acute Risk Factors for YQIHKVQ:25956387}Suicide:30414012}. Protective factors for this patient include: {Protective Factors for Suicide FIEP:32951884}Risk:30414013}. Considering these factors, the overall suicide risk at this point appears to be {Desc; low/moderate/high:110033}. Patient {ACTION; IS/IS ZYS:06301601}OT:21021397} appropriate for outpatient follow up.   Treatment Plan Summary: Plan as above  Neysa Hottereina Damiel Barthold, MD 11/28/201811:03 AM

## 2017-11-21 ENCOUNTER — Ambulatory Visit (HOSPITAL_COMMUNITY): Payer: Self-pay | Admitting: Psychiatry
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
## 2017-12-05 ENCOUNTER — Encounter (HOSPITAL_COMMUNITY): Payer: Self-pay | Admitting: Psychiatry

## 2017-12-05 ENCOUNTER — Ambulatory Visit (INDEPENDENT_AMBULATORY_CARE_PROVIDER_SITE_OTHER): Payer: Medicaid Other | Admitting: Psychiatry

## 2017-12-05 ENCOUNTER — Encounter (INDEPENDENT_AMBULATORY_CARE_PROVIDER_SITE_OTHER): Payer: Self-pay

## 2017-12-05 VITALS — BP 126/74 | HR 132 | Ht 64.0 in | Wt 205.0 lb

## 2017-12-05 DIAGNOSIS — K219 Gastro-esophageal reflux disease without esophagitis: Secondary | ICD-10-CM

## 2017-12-05 DIAGNOSIS — J45909 Unspecified asthma, uncomplicated: Secondary | ICD-10-CM

## 2017-12-05 DIAGNOSIS — M797 Fibromyalgia: Secondary | ICD-10-CM | POA: Diagnosis not present

## 2017-12-05 DIAGNOSIS — N3010 Interstitial cystitis (chronic) without hematuria: Secondary | ICD-10-CM

## 2017-12-05 DIAGNOSIS — I10 Essential (primary) hypertension: Secondary | ICD-10-CM

## 2017-12-05 DIAGNOSIS — F321 Major depressive disorder, single episode, moderate: Secondary | ICD-10-CM | POA: Diagnosis not present

## 2017-12-05 DIAGNOSIS — F17210 Nicotine dependence, cigarettes, uncomplicated: Secondary | ICD-10-CM | POA: Diagnosis not present

## 2017-12-05 DIAGNOSIS — R450 Nervousness: Secondary | ICD-10-CM

## 2017-12-05 DIAGNOSIS — Z818 Family history of other mental and behavioral disorders: Secondary | ICD-10-CM

## 2017-12-05 DIAGNOSIS — Z91410 Personal history of adult physical and sexual abuse: Secondary | ICD-10-CM

## 2017-12-05 DIAGNOSIS — F419 Anxiety disorder, unspecified: Secondary | ICD-10-CM

## 2017-12-05 DIAGNOSIS — G4700 Insomnia, unspecified: Secondary | ICD-10-CM

## 2017-12-05 MED ORDER — VENLAFAXINE HCL ER 150 MG PO CP24: 150.0000 mg | ORAL_CAPSULE | Freq: Every day | ORAL | 0 refills | Status: AC

## 2017-12-05 MED ORDER — VENLAFAXINE HCL ER 37.5 MG PO CP24: ORAL_CAPSULE | ORAL | 0 refills | Status: AC

## 2017-12-05 NOTE — Patient Instructions (Signed)
1. Start Effexor 37.5 mg daily for one week, then 75 mg daily for one week, then 150 mg daily 2. Decrease duloxetine 30 mg daily for one week, then disconitnue 3. Return to clinic in one month for 30 mins 4. Obtain TSH

## 2017-12-20 ENCOUNTER — Telehealth (HOSPITAL_COMMUNITY): Payer: Self-pay | Admitting: *Deleted

## 2017-12-20 NOTE — Telephone Encounter (Signed)
phone call regarding cancelled appointment on 12/25/17. provider out of office.  please call to reschedule.

## 2017-12-25 ENCOUNTER — Ambulatory Visit (HOSPITAL_COMMUNITY): Payer: Self-pay | Admitting: Psychiatry
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
## 2018-01-05 ENCOUNTER — Ambulatory Visit (HOSPITAL_COMMUNITY): Payer: Self-pay | Admitting: Psychiatry

## 2018-01-08 ENCOUNTER — Ambulatory Visit (HOSPITAL_COMMUNITY): Payer: Medicaid Other | Admitting: Psychiatry

## 2018-01-08 ENCOUNTER — Encounter (HOSPITAL_COMMUNITY): Payer: Self-pay

## 2018-01-09 ENCOUNTER — Encounter (HOSPITAL_COMMUNITY): Payer: Self-pay | Admitting: Psychiatry

## 2018-01-09 ENCOUNTER — Ambulatory Visit (INDEPENDENT_AMBULATORY_CARE_PROVIDER_SITE_OTHER): Payer: Medicaid Other | Admitting: Psychiatry

## 2018-01-09 DIAGNOSIS — F321 Major depressive disorder, single episode, moderate: Secondary | ICD-10-CM
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
## 2018-01-18 ENCOUNTER — Encounter (HOSPITAL_COMMUNITY): Payer: Self-pay | Admitting: Psychiatry

## 2018-01-18 ENCOUNTER — Ambulatory Visit (INDEPENDENT_AMBULATORY_CARE_PROVIDER_SITE_OTHER): Payer: Medicaid Other | Admitting: Psychiatry

## 2018-01-18 DIAGNOSIS — F4310 Post-traumatic stress disorder, unspecified: Secondary | ICD-10-CM

## 2018-01-18 DIAGNOSIS — F321 Major depressive disorder, single episode, moderate: Secondary | ICD-10-CM
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
## 2018-01-25 ENCOUNTER — Ambulatory Visit (HOSPITAL_COMMUNITY): Payer: Self-pay | Admitting: Psychiatry

## 2018-01-31 ENCOUNTER — Ambulatory Visit (INDEPENDENT_AMBULATORY_CARE_PROVIDER_SITE_OTHER): Payer: Medicaid Other | Admitting: Psychiatry

## 2018-01-31 DIAGNOSIS — F4310 Post-traumatic stress disorder, unspecified: Secondary | ICD-10-CM

## 2018-01-31 DIAGNOSIS — F321 Major depressive disorder, single episode, moderate: Secondary | ICD-10-CM
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
## 2018-02-01 ENCOUNTER — Ambulatory Visit (HOSPITAL_COMMUNITY): Payer: Self-pay | Admitting: Psychiatry

## 2018-02-08 ENCOUNTER — Ambulatory Visit (HOSPITAL_COMMUNITY): Payer: Medicaid Other | Admitting: Psychiatry
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
## 2018-02-15 ENCOUNTER — Ambulatory Visit (HOSPITAL_COMMUNITY): Payer: Self-pay | Admitting: Psychiatry

## 2018-05-21 ENCOUNTER — Other Ambulatory Visit: Payer: Self-pay

## 2018-05-21 ENCOUNTER — Encounter (HOSPITAL_COMMUNITY): Payer: Self-pay

## 2018-05-21 ENCOUNTER — Ambulatory Visit (HOSPITAL_COMMUNITY)
Admission: EM | Admit: 2018-05-21 | Discharge: 2018-05-21 | Disposition: A | Discharge status: Home / Self Care | Payer: Medicaid Other | Attending: Family Medicine | Admitting: Family Medicine

## 2018-05-21 DIAGNOSIS — H5712 Ocular pain, left eye: Secondary | ICD-10-CM

## 2018-05-21 DIAGNOSIS — H0289 Other specified disorders of eyelid: Secondary | ICD-10-CM

## 2018-05-21 DIAGNOSIS — H02846 Edema of left eye, unspecified eyelid: Secondary | ICD-10-CM

## 2018-05-21 DIAGNOSIS — H1032 Unspecified acute conjunctivitis, left eye: Secondary | ICD-10-CM

## 2018-05-21 MED ORDER — SULFAMETHOXAZOLE-TRIMETHOPRIM 800-160 MG PO TABS: 1.0000 | ORAL_TABLET | Freq: Two times a day (BID) | ORAL | 0 refills | Status: AC

## 2018-05-21 MED ORDER — TETRACAINE HCL 0.5 % OP SOLN
OPHTHALMIC | Status: AC
  Filled 2018-05-21: qty 4

## 2018-05-21 MED ORDER — OFLOXACIN 0.3 % OP SOLN: OPHTHALMIC | 0 refills | Status: AC

## 2018-05-21 NOTE — ED Triage Notes (Signed)
[facility], pt complains of eye swelling and pain in left eye x2 days, pt states her daughter recently had pink eye.

## 2018-05-21 NOTE — Discharge Instructions (Signed)
Start ofloxacin as directed to the left eye.  Bactrim as directed for possible cellulitis of the eye. Warm compresses as directed.  Please follow-up with ophthalmology in 2 to 3 days for reevaluation and monitor needed.  Monitor for any worsening of symptoms, changes in vision, sensitivity to light, worsening eye swelling, worsening painful eye movement, follow up with ophthalmology for further evaluation.

## 2018-05-21 NOTE — ED Provider Notes (Signed)
[facility]    CSN: [id number] Arrival date & time: 05/21/18  1833   History   Chief Complaint Chief Complaint  Patient presents with  . Eye Pain    HPI Kiara Dillon is a 37 y.o. Dillon.   37 year old Dillon comes in for 2-day history of left eye swelling, redness, pain.  Patient states daughter was recently treated for bacterial conjunctivitis, and thought that was the cause of symptoms.  However, swelling and pain has been worsening, and came in for evaluation.  States she has photophobia at baseline, but has been worse this past 2 days.  She has blurry vision without scotoma.  States painful eye movement.  Denies contact lens use, denies glasses use.  She had subjective fever 2 days ago that has since resolved.  Has rhinorrhea, nasal congestion. Denies injury/trauma.     Past Medical History:  Diagnosis Date  . Asthma   . Cholestasis of pregnancy   . Chronic back pain   . Chronic headache    photophobia  . Complication of anesthesia   . Degenerative disc disease   . Fibromyalgia   . GERD (gastroesophageal reflux disease)   . IBS (irritable bowel syndrome)   . 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 Plantar fasciitis, bilateral   . PONV (postoperative nausea and vomiting)   . Urgency of urination   . Urine retention     Patient Active Problem List   Diagnosis Date Noted  . 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 Trichomonal vaginitis 09/29/2017  . IBS (irritable bowel syndrome) 10/21/2015  . Diarrhea 05/11/2015  . Abdominal wall pain 12/04/2014  . Bloating 06/08/2014  . Abdominal pain, epigastric 03/18/2014  . Nausea with vomiting 03/18/2014  . Other malaise and fatigue 03/18/2014  . Gastroparesis 03/18/2014  . Loss of weight 03/18/2014  . Breast pain, right 10/01/2013    Past Surgical History:  Procedure Laterality Date  . BACTERIAL OVERGROWTH TEST N/A 09/16/2014   Procedure: BACTERIAL OVERGROWTH TEST;  Surgeon: Corbin Ade, MD;  [facility];  Service: Endoscopy;  Laterality: N/A;  0730 - rescheduled to 9/29 - Ginger to notify pt  . CHOLECYSTECTOMY N/A 04/14/2014   Procedure: LAPAROSCOPIC CHOLECYSTECTOMY;  Surgeon: Dalia Heading, MD;  Location: [facility];  Service: General;  Laterality: N/A;  . COLONOSCOPY  02/2014   Dr. Luisa Hart Hung:(propofol) normal  . CYSTO WITH HYDRODISTENSION N/A 12/18/2015   Procedure: CYSTOSCOPY/HYDRODISTENSION - INSTILL PYRIDIUM AND MARCAINE;  Surgeon: Bjorn Pippin, MD;  Location: [facility];  Service: Urology;  Laterality: N/A;  . ESOPHAGOGASTRODUODENOSCOPY  02/2014   Dr. Jeani Hawking: (propofol) retained fluid/gastric contents found in gastric fundus and body. Fluid aspiration to clear gastric lumen. Patient developed asthmatic exacerbation during procedure and had to be woke up to receive nebulizer.  Marland Kitchen GALLBLADDER SURGERY    . WISDOM TOOTH EXTRACTION      OB History    Gravida  2   Para  1   Term  1   Preterm  0   AB  1   Living  1     SAB  0   TAB  1   Ectopic  0   Multiple  0   Live Births  1            Home Medications    Prior to Admission medications   Medication Sig Start Date End Date Taking? Authorizing  Provider  amitriptyline (ELAVIL) 25 MG tablet Take 25 mg by mouth at bedtime.   Yes [provider]  hydrOXYzine (ATARAX/VISTARIL) 25 MG tablet Take 25 mg by mouth at bedtime.    [provider]  ofloxacin (OCUFLOX) 0.3 % ophthalmic solution 1 drop every 4 hours for the first 2 days, then 4 times a day for the next 5 days. 05/21/18   Cathie HoopsYu, Shalunda Lindh V, PA-C  sulfamethoxazole-trimethoprim (BACTRIM DS,SEPTRA DS) 800-160 MG tablet Take 1 tablet by mouth 2 (two) times daily for 7 days. 05/21/18 05/28/18  Belinda FisherYu, Natividad Schlosser V, PA-C  venlafaxine XR (EFFEXOR-XR) 150 MG 24 hr capsule Take 1 capsule (150 mg total) by mouth daily with breakfast. Start after finishing 75 mg daily for one week 12/05/17    Neysa HotterHisada, Reina, MD  venlafaxine XR (EFFEXOR-XR) 37.5 MG 24 hr capsule Start venlafaxine 37.5 mg daily for one week, then 75 mg daily for one week 12/05/17   Neysa HotterHisada, Reina, MD    Family History Family History  Problem Relation Age of Onset  . Arthritis Mother   . Asthma Mother   . Depression Mother   . Hypertension Mother   . Bipolar disorder Mother   . Suicidality Mother   . Stroke Paternal Uncle   . Heart disease Paternal Uncle   . Cancer Maternal Grandfather        kidney  . Heart disease Paternal Grandmother   . Miscarriages / Stillbirths Maternal Aunt   . Hypertension Sister     Social History Social History   Tobacco Use  . Smoking status: Current Some Day Smoker    Packs/day: 0.50    Years: 5.00    Pack years: 2.50    Types: Cigarettes    Last attempt to quit: 04/11/2011    Years since quitting: 7.1  . Smokeless tobacco: Never Used  . Tobacco comment: Quit x 2 years  Substance Use Topics  . Alcohol use: Yes    Comment: occ  . Drug use: No     Allergies   Penicillins and Other   Review of Systems Review of Systems  Reason unable to perform ROS: See HPI as above.     Physical Exam Triage Vital Signs ED Triage Vitals  Enc Vitals Group     BP 05/21/18 1929 115/79     Pulse Rate 05/21/18 1929 88     Resp 05/21/18 1929 17     Temp 05/21/18 1929 99.2 F (37.3 C)     Temp Source 05/21/18 1929 Oral     SpO2 05/21/18 1929 98 %     Weight --      Height --      Head Circumference --      Peak Flow --      Pain Score 05/21/18 1933 9     Pain Loc --      Pain Edu? --      Excl. in GC? --    No data found.  Updated Vital Signs BP 115/79 (BP Location: Left Arm)   Pulse 88   Temp 99.2 F (37.3 C) (Oral)   Resp 17   SpO2 98%   Visual Acuity Right Eye Distance:   Left Eye Distance:   20/30 ([place]) post tetracaine.  Bilateral Distance:    Right Eye Near:   Left Eye Near:    Bilateral Near:     Physical Exam  Constitutional: She is oriented to  person, place, and time. She appears well-developed and well-nourished.  No distress.  HENT:  Head: Normocephalic and atraumatic.  Eyes: Pupils are equal, round, and reactive to light. EOM are normal. Right conjunctiva is not injected. Left conjunctiva is injected.  See picture below.  Mild erythema of the eyelid without increased warmth.  Tenderness to palpation.  EOM full, although painful.  Fluorescein stain without uptake.  Neurological: She is alert and oriented to person, place, and time.        UC Treatments / Results  Labs (all labs ordered are listed, but only abnormal results are displayed) Labs Reviewed - No data to display  EKG None  Radiology No results found.  Procedures Procedures (including critical care time)  Medications Ordered in UC Medications - No data to display  Initial Impression / Assessment and Plan / UC Course  I have reviewed the triage vital signs and the nursing notes.  Pertinent labs & imaging results that were available during my care of the patient were reviewed by me and considered in my medical decision making (see chart for details).    Discussed case with Dr. Dayton Scrape.  Given visual acuity normal after tetracaine, will cover for bacterial conjunctivitis with ofloxacin.  Will cover for possible  preseptal cellulitis with Bactrim.  Discussed possible orbital cellulitis as well.  Patient to keep close monitoring.  Strict return precautions given.  Otherwise follow-up with ophthalmology in 2 to 3 days for reevaluation needed.  Patient expresses understanding and agrees to plan.  Final Clinical Impressions(s) / UC Diagnoses   Final diagnoses:  Acute conjunctivitis of left eye, unspecified acute conjunctivitis type  Pain and swelling of eyelid of left eye    ED Prescriptions    Medication Sig Dispense Auth. Provider   ofloxacin (OCUFLOX) 0.3 % ophthalmic solution 1 drop every 4 hours for the first 2 days, then 4 times a day for the next 5  days. 5 mL Alejandro Adcox V, PA-C   sulfamethoxazole-trimethoprim (BACTRIM DS,SEPTRA DS) 800-160 MG tablet Take 1 tablet by mouth 2 (two) times daily for 7 days. 14 tablet Threasa Alpha, [place] 05/21/18 2115

## 2018-05-21 NOTE — ED Notes (Signed)
Pt discharged by provider.

## 2019-01-08 ENCOUNTER — Other Ambulatory Visit: Payer: Self-pay

## 2019-01-08 ENCOUNTER — Emergency Department (HOSPITAL_BASED_OUTPATIENT_CLINIC_OR_DEPARTMENT_OTHER)
Admission: EM | Admit: 2019-01-08 | Discharge: 2019-01-08 | Disposition: A | Discharge status: Home / Self Care | Payer: Medicaid Other | Attending: Emergency Medicine | Admitting: Emergency Medicine

## 2019-01-08 ENCOUNTER — Emergency Department (HOSPITAL_BASED_OUTPATIENT_CLINIC_OR_DEPARTMENT_OTHER): Payer: Medicaid Other

## 2019-01-08 ENCOUNTER — Encounter (HOSPITAL_BASED_OUTPATIENT_CLINIC_OR_DEPARTMENT_OTHER): Payer: Self-pay | Admitting: *Deleted

## 2019-01-08 DIAGNOSIS — F17210 Nicotine dependence, cigarettes, uncomplicated: Secondary | ICD-10-CM | POA: Diagnosis not present

## 2019-01-08 DIAGNOSIS — Z79899 Other long term (current) drug therapy: Secondary | ICD-10-CM | POA: Diagnosis not present

## 2019-01-08 DIAGNOSIS — K588 Other irritable bowel syndrome: Secondary | ICD-10-CM | POA: Diagnosis not present

## 2019-01-08 DIAGNOSIS — K589 Irritable bowel syndrome without diarrhea: Secondary | ICD-10-CM

## 2019-01-08 DIAGNOSIS — R2240 Localized swelling, mass and lump, unspecified lower limb: Secondary | ICD-10-CM | POA: Diagnosis present

## 2019-01-08 LAB — URINALYSIS, ROUTINE W REFLEX MICROSCOPIC
Bilirubin Urine: NEGATIVE
Glucose, UA: NEGATIVE mg/dL
Hgb urine dipstick: NEGATIVE
Ketones, ur: NEGATIVE mg/dL
Nitrite: NEGATIVE
Protein, ur: NEGATIVE mg/dL
Specific Gravity, Urine: 1.02 (ref 1.005–1.030)
pH: 6.5 (ref 5.0–8.0)

## 2019-01-08 LAB — URINALYSIS, MICROSCOPIC (REFLEX)

## 2019-01-08 LAB — PREGNANCY, URINE: Preg Test, Ur: NEGATIVE

## 2019-01-08 MED ORDER — NAPROXEN 250 MG PO TABS
500.0000 mg | ORAL_TABLET | Freq: Once | ORAL | Status: AC
  Administered 2019-01-08: 500 mg via ORAL
  Filled 2019-01-08: qty 2

## 2019-01-08 MED ORDER — DICYCLOMINE HCL 20 MG PO TABS: 20.0000 mg | ORAL_TABLET | Freq: Two times a day (BID) | ORAL | 0 refills | Status: AC

## 2019-01-08 MED ORDER — ALUM & MAG HYDROXIDE-SIMETH 200-200-20 MG/5ML PO SUSP
30.0000 mL | Freq: Once | ORAL | Status: AC
  Administered 2019-01-08: 30 mL via ORAL
  Filled 2019-01-08: qty 30

## 2019-01-08 MED ORDER — DICYCLOMINE HCL 10 MG PO CAPS
20.0000 mg | ORAL_CAPSULE | Freq: Once | ORAL | Status: AC
  Administered 2019-01-08: 20 mg via ORAL
  Filled 2019-01-08: qty 2

## 2019-01-08 NOTE — ED Provider Notes (Signed)
[facility] EMERGENCY DEPARTMENT Provider Note   CSN: [id number] Arrival date & time: 01/08/19  0011     History   Chief Complaint Chief Complaint  Patient presents with  . Leg Swelling    HPI Kiara Dillon is a 38 y.o. female.  The history is provided by the patient.  Abdominal Cramping  This is a recurrent problem. The current episode started more than 2 days ago. The problem occurs constantly. The problem has not changed since onset.Pertinent negatives include no chest pain, no headaches and no shortness of breath. Nothing aggravates the symptoms. Nothing relieves the symptoms. She has tried nothing for the symptoms. The treatment provided no relief.  Patient with a h/o IBS and spasm who presents with spasm in abdomen.  She also states her feet were swollen yesterday but after putting them up they returned to normal.  No f/c/r.  No n/v/d.  No SOB, no CP.   Denies all other complaints.    Past Medical History:  Diagnosis Date  . Asthma   . Cholestasis of pregnancy   . Chronic back pain   . Chronic headache    photophobia  . Complication of anesthesia   . Degenerative disc disease   . Fibromyalgia   . GERD (gastroesophageal reflux disease)   . IBS (irritable bowel syndrome)   . 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 Plantar fasciitis, bilateral   . PONV (postoperative nausea and vomiting)   . Urgency of urination   . Urine retention     Patient Active Problem List   Diagnosis Date Noted  . 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 Trichomonal vaginitis 09/29/2017  . IBS (irritable bowel syndrome) 10/21/2015  . Diarrhea 05/11/2015  . Abdominal wall pain 12/04/2014  . Bloating 06/08/2014  . Abdominal pain, epigastric 03/18/2014  . Nausea with vomiting 03/18/2014  . Other malaise and fatigue 03/18/2014  . Gastroparesis 03/18/2014  . Loss of weight 03/18/2014  . Breast pain, right  10/01/2013    Past Surgical History:  Procedure Laterality Date  . BACTERIAL OVERGROWTH TEST N/A 09/16/2014   Procedure: BACTERIAL OVERGROWTH TEST;  Surgeon: Corbin Ade, MD;  Location: [facility];  Service: Endoscopy;  Laterality: N/A;  0730 - rescheduled to 9/29 - Ginger to notify pt  . CHOLECYSTECTOMY N/A 04/14/2014   Procedure: LAPAROSCOPIC CHOLECYSTECTOMY;  Surgeon: Dalia Heading, MD;  Location: [facility];  Service: General;  Laterality: N/A;  . COLONOSCOPY  02/2014   Dr. Luisa Hart Hung:(propofol) normal  . CYSTO WITH HYDRODISTENSION N/A 12/18/2015   Procedure: CYSTOSCOPY/HYDRODISTENSION - INSTILL PYRIDIUM AND MARCAINE;  Surgeon: Bjorn Pippin, MD;  Location: [facility];  Service: Urology;  Laterality: N/A;  . ESOPHAGOGASTRODUODENOSCOPY  02/2014   Dr. Jeani Hawking: (propofol) retained fluid/gastric contents found in gastric fundus and body. Fluid aspiration to clear gastric lumen. Patient developed asthmatic exacerbation during procedure and had to be woke up to receive nebulizer.  Marland Kitchen GALLBLADDER SURGERY    . WISDOM TOOTH EXTRACTION       OB History    Gravida  2   Para  1   Term  1   Preterm  0   AB  1   Living  1     SAB  0   TAB  1   Ectopic  0   Multiple  0   Live Births  1  Home Medications    Prior to Admission medications   Medication Sig Start Date End Date Taking? Authorizing Provider  amitriptyline (ELAVIL) 25 MG tablet Take 25 mg by mouth at bedtime.    [provider]  dicyclomine (BENTYL) 20 MG tablet Take 1 tablet (20 mg total) by mouth 2 (two) times daily. 01/08/19   Rejeana Fadness, MD  hydrOXYzine (ATARAX/VISTARIL) 25 MG tablet Take 25 mg by mouth at bedtime.    [provider]  ofloxacin (OCUFLOX) 0.3 % ophthalmic solution 1 drop every 4 hours for the first 2 days, then 4 times a day for the next 5 days. 05/21/18   Cathie Hoops, Amy V, PA-C  venlafaxine XR (EFFEXOR-XR) 150 MG 24 hr capsule Take 1 capsule (150 mg total) by mouth daily  with breakfast. Start after finishing 75 mg daily for one week 12/05/17   Neysa Hotter, MD  venlafaxine XR (EFFEXOR-XR) 37.5 MG 24 hr capsule Start venlafaxine 37.5 mg daily for one week, then 75 mg daily for one week 12/05/17   Neysa Hotter, MD    Family History Family History  Problem Relation Age of Onset  . Arthritis Mother   . Asthma Mother   . Depression Mother   . Hypertension Mother   . Bipolar disorder Mother   . Suicidality Mother   . Stroke Paternal Uncle   . Heart disease Paternal Uncle   . Cancer Maternal Grandfather        kidney  . Heart disease Paternal Grandmother   . Miscarriages / Stillbirths Maternal Aunt   . Hypertension Sister     Social History Social History   Tobacco Use  . Smoking status: Current Some Day Smoker    Packs/day: 0.50    Years: 5.00    Pack years: 2.50    Types: Cigarettes    Last attempt to quit: 04/11/2011    Years since quitting: 7.7  . Smokeless tobacco: Never Used  . Tobacco comment: Quit x 2 years  Substance Use Topics  . Alcohol use: Yes    Comment: occ  . Drug use: No     Allergies   Penicillins and Other   Review of Systems Review of Systems  Constitutional: Negative for appetite change, diaphoresis and fever.  Respiratory: Negative for chest tightness and shortness of breath.   Cardiovascular: Negative for chest pain and palpitations.  Gastrointestinal: Negative for anal bleeding, blood in stool, diarrhea, nausea, rectal pain and vomiting.  Genitourinary: Negative for flank pain, vaginal bleeding and vaginal discharge.  Neurological: Negative for headaches.  All other systems reviewed and are negative.    Physical Exam Updated Vital Signs BP (!) 122/93 (BP Location: Left Arm)   Pulse 95   Temp 97.7 F (36.5 C) (Oral)   Resp 18   Ht 5\' 3"  (1.6 m)   Wt 90.7 kg   SpO2 99%   BMI 35.43 kg/m   Physical Exam Constitutional:      Appearance: She is obese. She is not ill-appearing.     Comments:  Sleeping soundly in the room  HENT:     Head: Normocephalic and atraumatic.     Nose: Nose normal.     Mouth/Throat:     Mouth: Mucous membranes are moist.     Pharynx: No oropharyngeal exudate.  Eyes:     Conjunctiva/sclera: Conjunctivae normal.     Pupils: Pupils are equal, round, and reactive to light.  Neck:     Musculoskeletal: Normal range of motion and neck  supple.  Cardiovascular:     Rate and Rhythm: Normal rate and regular rhythm.     Pulses: Normal pulses.     Heart sounds: Normal heart sounds.  Pulmonary:     Effort: Pulmonary effort is normal.     Breath sounds: Normal breath sounds.  Abdominal:     General: Abdomen is protuberant. Bowel sounds are increased. There is no distension.     Palpations: Abdomen is soft. There is no mass.     Tenderness: There is no abdominal tenderness. There is no right CVA tenderness, left CVA tenderness, guarding or rebound.     Hernia: No hernia is present.  Neurological:     Mental Status: She is alert.      ED Treatments / Results  Labs (all labs ordered are listed, but only abnormal results are displayed) Results for orders placed or performed during the hospital encounter of 01/08/19  Urinalysis, Routine w reflex microscopic  Result Value Ref Range   Color, Urine YELLOW YELLOW   APPearance CLOUDY (A) CLEAR   Specific Gravity, Urine 1.020 1.005 - 1.030   pH 6.5 5.0 - 8.0   Glucose, UA NEGATIVE NEGATIVE mg/dL   Hgb urine dipstick NEGATIVE NEGATIVE   Bilirubin Urine NEGATIVE NEGATIVE   Ketones, ur NEGATIVE NEGATIVE mg/dL   Protein, ur NEGATIVE NEGATIVE mg/dL   Nitrite NEGATIVE NEGATIVE   Leukocytes, UA TRACE (A) NEGATIVE  Pregnancy, urine  Result Value Ref Range   Preg Test, Ur NEGATIVE NEGATIVE  Urinalysis, Microscopic (reflex)  Result Value Ref Range   RBC / HPF 0-5 0 - 5 RBC/hpf   WBC, UA 6-10 0 - 5 WBC/hpf   Bacteria, UA FEW (A) NONE SEEN   Squamous Epithelial / LPF 6-10 0 - 5   Mucus PRESENT    Dg Abdomen  Acute W/chest  Result Date: 01/08/2019 CLINICAL DATA:  Initial evaluation for acute left upper quadrant spasms. EXAM: DG ABDOMEN ACUTE W/ 1V CHEST COMPARISON:  Prior CT from 07/04/2017. FINDINGS: Cardiac and mediastinal silhouettes within normal limits. Lungs normally inflated. No focal infiltrates. No edema or effusion. No pneumothorax. Bowel gas pattern within normal limits without evidence for obstruction or ileus. No abnormal bowel wall thickening. No free air. Moderate stool within the right colon. No soft tissue mass or abnormal calcification. Cholecystectomy clips noted. Visualized osseous structures within normal limits. IMPRESSION: 1. Nonobstructive bowel gas pattern with no radiographic evidence for acute intra-abdominal process. 2. No active cardiopulmonary disease. Electronically Signed   By: Rise Mu M.D.   On: 01/08/2019 04:16    EKG None  Radiology Dg Abdomen Acute W/chest  Result Date: 01/08/2019 CLINICAL DATA:  Initial evaluation for acute left upper quadrant spasms. EXAM: DG ABDOMEN ACUTE W/ 1V CHEST COMPARISON:  Prior CT from 07/04/2017. FINDINGS: Cardiac and mediastinal silhouettes within normal limits. Lungs normally inflated. No focal infiltrates. No edema or effusion. No pneumothorax. Bowel gas pattern within normal limits without evidence for obstruction or ileus. No abnormal bowel wall thickening. No free air. Moderate stool within the right colon. No soft tissue mass or abnormal calcification. Cholecystectomy clips noted. Visualized osseous structures within normal limits. IMPRESSION: 1. Nonobstructive bowel gas pattern with no radiographic evidence for acute intra-abdominal process. 2. No active cardiopulmonary disease. Electronically Signed   By: Rise Mu M.D.   On: 01/08/2019 04:16    Procedures Procedures (including critical care time)  Medications Ordered in ED Medications  dicyclomine (BENTYL) capsule 20 mg (20 mg Oral Given 01/08/19 0432)  naproxen (NAPROSYN) tablet 500 mg (500 mg Oral Given 01/08/19 0432)  alum & mag hydroxide-simeth (MAALOX/MYLANTA) 200-200-20 MG/5ML suspension 30 mL (30 mLs Oral Given 01/08/19 0432)       Final Clinical Impressions(s) / ED Diagnoses   Final diagnoses:  Irritable bowel syndrome, unspecified type  Exam and vitals are benign and reassuring and this problem is not new.  There is no indication for advanced imagine at this time.    Patient with IBS with constipation not on medication at this time.  Will start antispasmotics and refer to GI.  Low sodium diet and follow up with your PMD for recheck.  Start miralax therapy.    Return for pain, intractable cough, productive cough,fevers >100.4 unrelieved by medication, shortness of breath, intractable vomiting, or diarrhea, abdominal pain, Inability to tolerate liquids or food, cough, altered mental status or any concerns. No signs of systemic illness or infection. The patient is nontoxic-appearing on exam and vital signs are within normal limits.   I have reviewed the triage vital signs and the nursing notes. Pertinent labs &imaging results that were available during my care of the patient were reviewed by me and considered in my medical decision making (see chart for details).  After history, exam, and medical workup I feel the patient has been appropriately medically screened and is safe for discharge home. Pertinent diagnoses were discussed with the patient. Patient was given return precautions.  ED Discharge Orders         Ordered    dicyclomine (BENTYL) 20 MG tablet  2 times daily     01/08/19 0435           Rudene Poulsen, MD 01/08/19 [phone]

## 2019-01-08 NOTE — ED Notes (Signed)
Pt c/o abd pain LUQ, vaginal spotting. Bloating, hand swelling and leg swelling . SX for several days. Denies N/V/D.

## 2019-01-08 NOTE — ED Triage Notes (Signed)
Pt reports left leg swelling that has improved, reports LUQ 'spasms' with movement x 2 weeks. Denies injury.

## 2019-02-18 ENCOUNTER — Encounter (HOSPITAL_BASED_OUTPATIENT_CLINIC_OR_DEPARTMENT_OTHER): Payer: Self-pay

## 2019-02-18 DIAGNOSIS — G4733 Obstructive sleep apnea (adult) (pediatric): Secondary | ICD-10-CM

## 2019-03-13 ENCOUNTER — Ambulatory Visit: Payer: Self-pay | Admitting: Certified Nurse Midwife

## 2019-03-22 ENCOUNTER — Telehealth: Payer: Self-pay | Admitting: *Deleted

## 2019-03-22 NOTE — Telephone Encounter (Signed)
Attempted to reach patient, left message.  Upcoming appointment for nexplanon replacement cancelled to due mandated COVID restrictions.  Offered televisit for [employer] consult until nexplanon is cleared to reschedule.

## 2019-03-27 ENCOUNTER — Ambulatory Visit: Payer: Self-pay | Admitting: Certified Nurse Midwife
